# Patient Record
Sex: Female | Born: 1955 | Race: White | Hispanic: No | Marital: Single | Smoking: Never smoker
Health system: Southern US, Community
[De-identification: ages and names within clinical notes are randomized; demographics above are authoritative.]

## PROBLEM LIST (undated history)

## (undated) DIAGNOSIS — H3530 Unspecified macular degeneration: Secondary | ICD-10-CM

## (undated) DIAGNOSIS — K567 Ileus, unspecified: Secondary | ICD-10-CM

## (undated) DIAGNOSIS — F419 Anxiety disorder, unspecified: Secondary | ICD-10-CM

## (undated) DIAGNOSIS — K219 Gastro-esophageal reflux disease without esophagitis: Secondary | ICD-10-CM

## (undated) DIAGNOSIS — D649 Anemia, unspecified: Secondary | ICD-10-CM

## (undated) DIAGNOSIS — G43909 Migraine, unspecified, not intractable, without status migrainosus: Secondary | ICD-10-CM

## (undated) DIAGNOSIS — Z87442 Personal history of urinary calculi: Secondary | ICD-10-CM

## (undated) DIAGNOSIS — F32A Depression, unspecified: Secondary | ICD-10-CM

## (undated) DIAGNOSIS — A159 Respiratory tuberculosis unspecified: Secondary | ICD-10-CM

## (undated) DIAGNOSIS — Z8249 Family history of ischemic heart disease and other diseases of the circulatory system: Secondary | ICD-10-CM

## (undated) DIAGNOSIS — I10 Essential (primary) hypertension: Secondary | ICD-10-CM

## (undated) DIAGNOSIS — Z972 Presence of dental prosthetic device (complete) (partial): Secondary | ICD-10-CM

## (undated) DIAGNOSIS — F329 Major depressive disorder, single episode, unspecified: Secondary | ICD-10-CM

## (undated) DIAGNOSIS — Z973 Presence of spectacles and contact lenses: Secondary | ICD-10-CM

## (undated) HISTORY — DX: Anxiety disorder, unspecified: F41.9

## (undated) HISTORY — DX: Migraine, unspecified, not intractable, without status migrainosus: G43.909

## (undated) HISTORY — PX: COLONOSCOPY: SHX174

## (undated) HISTORY — PX: CATARACT EXTRACTION W/ INTRAOCULAR LENS  IMPLANT, BILATERAL: SHX1307

## (undated) HISTORY — DX: Depression, unspecified: F32.A

## (undated) HISTORY — DX: Major depressive disorder, single episode, unspecified: F32.9

---

## 1984-08-28 DIAGNOSIS — A159 Respiratory tuberculosis unspecified: Secondary | ICD-10-CM

## 1984-08-28 HISTORY — DX: Respiratory tuberculosis unspecified: A15.9

## 1998-05-30 ENCOUNTER — Emergency Department (HOSPITAL_COMMUNITY): Admission: EM | Admit: 1998-05-30 | Discharge: 1998-05-30 | Payer: Self-pay | Admitting: Emergency Medicine

## 1998-06-09 ENCOUNTER — Ambulatory Visit (HOSPITAL_COMMUNITY): Admission: RE | Admit: 1998-06-09 | Discharge: 1998-06-09 | Payer: Self-pay | Admitting: Gastroenterology

## 1999-03-24 ENCOUNTER — Encounter: Payer: Self-pay | Admitting: Unknown Physician Specialty

## 1999-03-24 ENCOUNTER — Ambulatory Visit (HOSPITAL_COMMUNITY): Admission: RE | Admit: 1999-03-24 | Discharge: 1999-03-24 | Payer: Self-pay | Admitting: Unknown Physician Specialty

## 2000-01-03 ENCOUNTER — Encounter: Payer: Self-pay | Admitting: Family Medicine

## 2000-01-03 ENCOUNTER — Encounter: Admission: RE | Admit: 2000-01-03 | Discharge: 2000-01-03 | Payer: Self-pay | Admitting: Family Medicine

## 2001-01-03 ENCOUNTER — Encounter: Payer: Self-pay | Admitting: Family Medicine

## 2001-01-03 ENCOUNTER — Encounter: Admission: RE | Admit: 2001-01-03 | Discharge: 2001-01-03 | Payer: Self-pay | Admitting: Family Medicine

## 2002-01-07 ENCOUNTER — Encounter: Admission: RE | Admit: 2002-01-07 | Discharge: 2002-01-07 | Payer: Self-pay | Admitting: Family Medicine

## 2002-01-07 ENCOUNTER — Encounter: Payer: Self-pay | Admitting: Family Medicine

## 2003-01-09 ENCOUNTER — Encounter: Payer: Self-pay | Admitting: Family Medicine

## 2003-01-09 ENCOUNTER — Encounter: Admission: RE | Admit: 2003-01-09 | Discharge: 2003-01-09 | Payer: Self-pay | Admitting: Family Medicine

## 2004-11-03 ENCOUNTER — Encounter: Admission: RE | Admit: 2004-11-03 | Discharge: 2004-11-03 | Payer: Self-pay | Admitting: Family Medicine

## 2005-11-30 ENCOUNTER — Ambulatory Visit (HOSPITAL_COMMUNITY): Admission: RE | Admit: 2005-11-30 | Discharge: 2005-11-30 | Payer: Self-pay | Admitting: Family Medicine

## 2005-12-14 ENCOUNTER — Other Ambulatory Visit: Admission: RE | Admit: 2005-12-14 | Discharge: 2005-12-14 | Payer: Self-pay | Admitting: Family Medicine

## 2007-12-10 ENCOUNTER — Encounter: Admission: RE | Admit: 2007-12-10 | Discharge: 2007-12-10 | Payer: Self-pay | Admitting: Family Medicine

## 2008-07-10 ENCOUNTER — Ambulatory Visit: Admission: RE | Admit: 2008-07-10 | Discharge: 2008-07-10 | Payer: Self-pay | Admitting: Family Medicine

## 2008-07-10 ENCOUNTER — Encounter (INDEPENDENT_AMBULATORY_CARE_PROVIDER_SITE_OTHER): Payer: Self-pay | Admitting: Family Medicine

## 2008-07-10 ENCOUNTER — Ambulatory Visit: Payer: Self-pay | Admitting: Vascular Surgery

## 2008-12-29 ENCOUNTER — Ambulatory Visit (HOSPITAL_COMMUNITY): Admission: RE | Admit: 2008-12-29 | Discharge: 2008-12-29 | Payer: Self-pay | Admitting: Family Medicine

## 2010-01-19 ENCOUNTER — Ambulatory Visit (HOSPITAL_COMMUNITY): Admission: RE | Admit: 2010-01-19 | Discharge: 2010-01-19 | Payer: Self-pay | Admitting: Family Medicine

## 2010-12-12 ENCOUNTER — Other Ambulatory Visit (HOSPITAL_COMMUNITY): Payer: Self-pay | Admitting: Family Medicine

## 2010-12-12 DIAGNOSIS — Z1231 Encounter for screening mammogram for malignant neoplasm of breast: Secondary | ICD-10-CM

## 2011-01-31 ENCOUNTER — Ambulatory Visit (HOSPITAL_COMMUNITY)
Admission: RE | Admit: 2011-01-31 | Discharge: 2011-01-31 | Disposition: A | Discharge status: Home / Self Care | Payer: BC Managed Care – PPO | Source: Ambulatory Visit | Attending: Family Medicine | Admitting: Family Medicine

## 2011-01-31 DIAGNOSIS — Z1231 Encounter for screening mammogram for malignant neoplasm of breast: Secondary | ICD-10-CM | POA: Insufficient documentation

## 2011-05-16 ENCOUNTER — Ambulatory Visit: Payer: BC Managed Care – PPO | Admitting: Hematology & Oncology

## 2011-06-05 ENCOUNTER — Other Ambulatory Visit: Payer: Self-pay | Admitting: Hematology & Oncology

## 2011-06-05 ENCOUNTER — Ambulatory Visit (HOSPITAL_BASED_OUTPATIENT_CLINIC_OR_DEPARTMENT_OTHER): Payer: BC Managed Care – PPO | Admitting: Hematology & Oncology

## 2011-06-05 DIAGNOSIS — R791 Abnormal coagulation profile: Secondary | ICD-10-CM

## 2011-06-05 DIAGNOSIS — R7989 Other specified abnormal findings of blood chemistry: Secondary | ICD-10-CM

## 2011-06-05 LAB — CBC WITH DIFFERENTIAL (CANCER CENTER ONLY)
Eosinophils Absolute: 0.1 10*3/uL (ref 0.0–0.5)
MONO#: 0.4 10*3/uL (ref 0.1–0.9)
NEUT#: 3.4 10*3/uL (ref 1.5–6.5)
Platelets: 304 10*3/uL (ref 145–400)
RBC: 4.61 10*6/uL (ref 3.70–5.32)
WBC: 5.4 10*3/uL (ref 3.9–10.0)

## 2011-06-05 LAB — CHCC SATELLITE - SMEAR

## 2011-06-08 LAB — LUPUS ANTICOAGULANT PANEL
DRVVT: 36.5 secs (ref 34.1–42.2)
Lupus Anticoagulant: NOT DETECTED
PTT Lupus Anticoagulant: 41.8 secs (ref 30.0–45.6)

## 2011-06-08 LAB — VON WILLEBRAND PANEL: Coagulation Factor VIII: 48 % — ABNORMAL LOW (ref 73–140)

## 2011-06-13 LAB — LACTATE DEHYDROGENASE: LDH: 171 U/L (ref 94–250)

## 2011-06-13 LAB — CARDIOLIPIN ANTIBODIES, IGG, IGM, IGA
Anticardiolipin IgA: 3 APL U/mL (ref <22)
Anticardiolipin IgM: 4 MPL U/mL (ref <11)

## 2011-12-27 ENCOUNTER — Other Ambulatory Visit (HOSPITAL_COMMUNITY): Payer: Self-pay | Admitting: Family Medicine

## 2011-12-27 DIAGNOSIS — Z1231 Encounter for screening mammogram for malignant neoplasm of breast: Secondary | ICD-10-CM

## 2012-02-23 ENCOUNTER — Ambulatory Visit (HOSPITAL_COMMUNITY)
Admission: RE | Admit: 2012-02-23 | Discharge: 2012-02-23 | Disposition: A | Discharge status: Home / Self Care | Payer: BC Managed Care – PPO | Source: Ambulatory Visit | Attending: Family Medicine | Admitting: Family Medicine

## 2012-02-23 DIAGNOSIS — Z1231 Encounter for screening mammogram for malignant neoplasm of breast: Secondary | ICD-10-CM

## 2013-02-10 ENCOUNTER — Other Ambulatory Visit: Payer: Self-pay | Admitting: Family Medicine

## 2013-02-10 DIAGNOSIS — H539 Unspecified visual disturbance: Secondary | ICD-10-CM

## 2013-02-12 ENCOUNTER — Ambulatory Visit
Admission: RE | Admit: 2013-02-12 | Discharge: 2013-02-12 | Disposition: A | Discharge status: Home / Self Care | Payer: BC Managed Care – PPO | Source: Ambulatory Visit | Attending: Family Medicine | Admitting: Family Medicine

## 2013-02-12 DIAGNOSIS — H539 Unspecified visual disturbance: Secondary | ICD-10-CM

## 2013-02-25 ENCOUNTER — Other Ambulatory Visit (HOSPITAL_COMMUNITY): Payer: Self-pay | Admitting: Family Medicine

## 2013-02-25 DIAGNOSIS — Z1231 Encounter for screening mammogram for malignant neoplasm of breast: Secondary | ICD-10-CM

## 2013-03-05 ENCOUNTER — Ambulatory Visit (HOSPITAL_COMMUNITY)
Admission: RE | Admit: 2013-03-05 | Discharge: 2013-03-05 | Disposition: A | Discharge status: Home / Self Care | Payer: BC Managed Care – PPO | Source: Ambulatory Visit | Attending: Family Medicine | Admitting: Family Medicine

## 2013-03-05 DIAGNOSIS — Z1231 Encounter for screening mammogram for malignant neoplasm of breast: Secondary | ICD-10-CM | POA: Insufficient documentation

## 2013-03-10 ENCOUNTER — Ambulatory Visit (INDEPENDENT_AMBULATORY_CARE_PROVIDER_SITE_OTHER): Payer: BC Managed Care – PPO | Admitting: Diagnostic Neuroimaging

## 2013-03-10 ENCOUNTER — Encounter: Payer: Self-pay | Admitting: Diagnostic Neuroimaging

## 2013-03-10 VITALS — BP 126/78 | HR 76 | Temp 99.1°F | Ht 62.0 in | Wt 193.0 lb

## 2013-03-10 DIAGNOSIS — G43809 Other migraine, not intractable, without status migrainosus: Secondary | ICD-10-CM

## 2013-03-10 DIAGNOSIS — G43109 Migraine with aura, not intractable, without status migrainosus: Secondary | ICD-10-CM

## 2013-03-10 NOTE — Progress Notes (Signed)
[facility]  PATIENT: Kristin Myers DOB: [birth year]  REFERRING CLINICIAN: Little HISTORY FROM: patient REASON FOR VISIT: new consult   HISTORICAL  CHIEF COMPLAINT:  Chief Complaint  Patient presents with  . Headache    Left side behind eye    HISTORY OF PRESENT ILLNESS:   57 year old right-handed female here for evaluation of headache and vision changes.  3 weeks ago patient was at work and had the sudden onset of "smudged over the left eye, which progressed to loss of vision in the left eye for 15 minutes. She then had a severe throbbing headache on left side with nausea, dizziness, photophobia and phonophobia. Patient has been evaluated by ophthalmology and PCP. MRI of the brain and carotid ultrasound up and down.  Since that time she continues to have dull aching headache on left side, with some decreased sensation left face, left arm and left leg.  Patient has remote history of migraine headaches since 1976, during her freshman year of college. No further migraines since that time.  REVIEW OF SYSTEMS: Full 14 system review of systems performed and notable only for weight gain swelling in legs ringing in ear spinning sensation blurred vision loss of vision eye pain anemia easy bruising headache numbness weakness dizziness.  ALLERGIES: Allergies  Allergen Reactions  . Bee Venom   . Sulfa Antibiotics   . Tramadol     HOME MEDICATIONS: No outpatient prescriptions prior to visit.   No facility-administered medications prior to visit.    PAST MEDICAL HISTORY: Past Medical History  Diagnosis Date  . Migraine   . Depression   . Anxiety     PAST SURGICAL HISTORY: History reviewed. No pertinent past surgical history.  FAMILY HISTORY: Family History  Problem Relation Age of Onset  . Heart attack Father   . Breast cancer Sister     SOCIAL HISTORY:  History   Social History  . Marital Status: Single    Spouse Name: N/A   Number of Children: 2  . Years of Education: MA   Occupational History  . RECEIVER [employer]   Social History Main Topics  . Smoking status: Never Smoker   . Smokeless tobacco: Never Used  . Alcohol Use: Yes     Comment: occasionally; once or twice annually  . Drug Use: No  . Sexually Active: Not on file   Other Topics Concern  . Not on file   Social History Narrative   Patient lives alone.    Caffeine Use: 1 soda daily.     PHYSICAL EXAM  Filed Vitals:   03/10/13 1102  BP: 126/78  Pulse: 76  Temp: 99.1 F (37.3 C)  TempSrc: Oral  Height: 5\' 2"  (1.575 m)  Weight: 193 lb (87.544 kg)    Not recorded    Body mass index is 35.29 kg/(m^2).  GENERAL EXAM: Patient is in no distress  CARDIOVASCULAR: Regular rate and rhythm, no murmurs, no carotid bruits  NEUROLOGIC: MENTAL STATUS: awake, alert, language fluent, comprehension intact, naming intact CRANIAL NERVE: no papilledema on fundoscopic exam, pupils equal and reactive to light, visual fields full to confrontation, extraocular muscles intact, no nystagmus, facial sensation and strength symmetric, uvula midline, shoulder shrug symmetric, tongue midline. MOTOR: normal bulk and tone, full strength in the BUE, BLE SENSORY: normal and symmetric to light touch, pinprick, temperature, vibration COORDINATION: finger-nose-finger, fine finger movements normal REFLEXES: deep tendon reflexes present and symmetric GAIT/STATION: narrow based gait; able to walk on toes, heels and tandem; romberg  is negative   DIAGNOSTIC DATA (LABS, IMAGING, TESTING) - I reviewed patient records, labs, notes, testing and imaging myself where available.  Lab Results  Component Value Date   WBC 5.4 06/05/2011   HGB 13.2 06/05/2011   HCT 38.0 06/05/2011   MCV 82 06/05/2011   PLT 304 06/05/2011   No results found for this basename: na, k, cl, co2, glucose, bun, creatinine, calcium, prot, albumin, ast, alt, alkphos, bilitot, gfrnonaa, gfraa   No  results found for this basename: CHOL, HDL, LDLCALC, LDLDIRECT, TRIG, CHOLHDL   No results found for this basename: HGBA1C   No results found for this basename: VITAMINB12   No results found for this basename: TSH    02/12/13 MRI brain - No acute infarct. Minimal nonspecific white matter type changes as noted above.  02/12/13 carotid u/s - normal    ASSESSMENT AND PLAN  57 y.o. year old female  has a past medical history of Migraine; Depression; and Anxiety. 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 TIA less likely.  PLAN: 1. Ask PCP to complete TIA workup if not already done (2d-echocardiogram, lipid panel, diabetes screening) 2. Continue aspirin 81mg  daily 3. Regarding headaches, we will observe for next 4 weeks and see if her headaches resolve. She has been headache free otherwise since 1976 and therefore we will see how she does over next few months before instituting regular antimigraine treatments.   No orders of the defined types were placed in this encounter.     Meds ordered this encounter  Medications  . HYDROcodone-acetaminophen (NORCO) 10-325 MG per tablet    Sig: Take 1 tablet by mouth as needed.  . promethazine (PHENERGAN) 25 MG tablet    Sig: Take 1 tablet by mouth as needed.     Suanne Marker, MD 03/10/2013, 11:58 AM Certified in Neurology, Neurophysiology and Neuroimaging  [facility] [address], [address] [place], [place] [address] [phone]

## 2013-03-10 NOTE — Patient Instructions (Signed)
Use ibuprofen or aleve as needed for breakthrough headache.

## 2014-02-02 ENCOUNTER — Other Ambulatory Visit (HOSPITAL_COMMUNITY): Payer: Self-pay | Admitting: Family Medicine

## 2014-02-02 DIAGNOSIS — Z1231 Encounter for screening mammogram for malignant neoplasm of breast: Secondary | ICD-10-CM

## 2014-03-09 ENCOUNTER — Ambulatory Visit (HOSPITAL_COMMUNITY)
Admission: RE | Admit: 2014-03-09 | Discharge: 2014-03-09 | Disposition: A | Discharge status: Home / Self Care | Payer: BC Managed Care – PPO | Source: Ambulatory Visit | Attending: Family Medicine | Admitting: Family Medicine

## 2014-03-09 DIAGNOSIS — Z1231 Encounter for screening mammogram for malignant neoplasm of breast: Secondary | ICD-10-CM

## 2015-02-11 ENCOUNTER — Other Ambulatory Visit (HOSPITAL_COMMUNITY): Payer: Self-pay | Admitting: Family Medicine

## 2015-02-11 DIAGNOSIS — Z1231 Encounter for screening mammogram for malignant neoplasm of breast: Secondary | ICD-10-CM

## 2015-03-26 ENCOUNTER — Ambulatory Visit (HOSPITAL_COMMUNITY)
Admission: RE | Admit: 2015-03-26 | Discharge: 2015-03-26 | Disposition: A | Discharge status: Home / Self Care | Payer: BLUE CROSS/BLUE SHIELD | Source: Ambulatory Visit | Attending: Family Medicine | Admitting: Family Medicine

## 2015-03-26 DIAGNOSIS — Z1231 Encounter for screening mammogram for malignant neoplasm of breast: Secondary | ICD-10-CM | POA: Insufficient documentation

## 2015-11-03 ENCOUNTER — Other Ambulatory Visit: Payer: Self-pay | Admitting: Gastroenterology

## 2015-11-03 DIAGNOSIS — R4702 Dysphasia: Secondary | ICD-10-CM

## 2015-11-09 ENCOUNTER — Ambulatory Visit
Admission: RE | Admit: 2015-11-09 | Discharge: 2015-11-09 | Disposition: A | Discharge status: Home / Self Care | Payer: BLUE CROSS/BLUE SHIELD | Source: Ambulatory Visit | Attending: Gastroenterology | Admitting: Gastroenterology

## 2015-11-09 ENCOUNTER — Other Ambulatory Visit: Payer: Self-pay | Admitting: Gastroenterology

## 2015-11-09 DIAGNOSIS — R1310 Dysphagia, unspecified: Secondary | ICD-10-CM

## 2015-11-09 DIAGNOSIS — R4702 Dysphasia: Secondary | ICD-10-CM

## 2015-11-23 ENCOUNTER — Other Ambulatory Visit: Payer: Self-pay | Admitting: Gastroenterology

## 2015-11-24 ENCOUNTER — Encounter (HOSPITAL_COMMUNITY): Payer: Self-pay | Admitting: *Deleted

## 2015-11-24 NOTE — Progress Notes (Signed)
Pt denies SOB, chest pain, and being under the care of a cardiologist. Pt denies having a stress test, echo and cardiac cath. Pt denies having a chest x ray and EKG within the last year. Pt made aware to stop taking Aspirin, vitamins, fish oil and herbal medications. Do not take any NSAIDs ie: Ibuprofen, Advil, Naproxen, BC and Goody Powder or any medication containing Aspirin. Pt verbalized understanding of all pre-op instructions. 

## 2015-11-25 ENCOUNTER — Ambulatory Visit (HOSPITAL_COMMUNITY)
Admission: RE | Admit: 2015-11-25 | Discharge: 2015-11-25 | Disposition: A | Discharge status: Home / Self Care | Payer: BLUE CROSS/BLUE SHIELD | Source: Ambulatory Visit | Attending: Gastroenterology | Admitting: Gastroenterology

## 2015-11-25 ENCOUNTER — Ambulatory Visit (HOSPITAL_COMMUNITY): Payer: BLUE CROSS/BLUE SHIELD

## 2015-11-25 ENCOUNTER — Ambulatory Visit (HOSPITAL_COMMUNITY): Payer: BLUE CROSS/BLUE SHIELD | Admitting: Anesthesiology

## 2015-11-25 ENCOUNTER — Encounter (HOSPITAL_COMMUNITY)
Admission: RE | Disposition: A | Discharge status: Home / Self Care | Payer: Self-pay | Source: Ambulatory Visit | Attending: Gastroenterology

## 2015-11-25 ENCOUNTER — Encounter (HOSPITAL_COMMUNITY): Payer: Self-pay | Admitting: *Deleted

## 2015-11-25 DIAGNOSIS — R1310 Dysphagia, unspecified: Secondary | ICD-10-CM | POA: Insufficient documentation

## 2015-11-25 DIAGNOSIS — K210 Gastro-esophageal reflux disease with esophagitis: Secondary | ICD-10-CM | POA: Diagnosis not present

## 2015-11-25 DIAGNOSIS — K222 Esophageal obstruction: Secondary | ICD-10-CM | POA: Diagnosis not present

## 2015-11-25 DIAGNOSIS — Z8719 Personal history of other diseases of the digestive system: Secondary | ICD-10-CM

## 2015-11-25 HISTORY — DX: Respiratory tuberculosis unspecified: A15.9

## 2015-11-25 HISTORY — DX: Personal history of other diseases of the digestive system: Z87.19

## 2015-11-25 HISTORY — PX: SAVORY DILATION: SHX5439

## 2015-11-25 HISTORY — DX: Gastro-esophageal reflux disease without esophagitis: K21.9

## 2015-11-25 HISTORY — PX: ESOPHAGOGASTRODUODENOSCOPY (EGD) WITH PROPOFOL: SHX5813

## 2015-11-25 SURGERY — ESOPHAGOGASTRODUODENOSCOPY (EGD) WITH PROPOFOL
Anesthesia: Monitor Anesthesia Care

## 2015-11-25 MED ORDER — BUTAMBEN-TETRACAINE-BENZOCAINE 2-2-14 % EX AERO
INHALATION_SPRAY | CUTANEOUS | Status: DC | PRN
  Administered 2015-11-25: 2 via TOPICAL

## 2015-11-25 MED ORDER — ONDANSETRON HCL 4 MG/2ML IJ SOLN
INTRAMUSCULAR | Status: DC | PRN
  Administered 2015-11-25: 4 mg via INTRAVENOUS

## 2015-11-25 MED ORDER — PROPOFOL 500 MG/50ML IV EMUL
INTRAVENOUS | Status: DC | PRN
  Administered 2015-11-25: 50 ug/kg/min via INTRAVENOUS

## 2015-11-25 MED ORDER — MIDAZOLAM HCL 5 MG/5ML IJ SOLN
INTRAMUSCULAR | Status: DC | PRN
  Administered 2015-11-25: 2 mg via INTRAVENOUS

## 2015-11-25 MED ORDER — FENTANYL CITRATE (PF) 100 MCG/2ML IJ SOLN
INTRAMUSCULAR | Status: DC | PRN
  Administered 2015-11-25 (×4): 50 ug via INTRAVENOUS

## 2015-11-25 MED ORDER — SODIUM CHLORIDE 0.9 % IV SOLN: INTRAVENOUS | Status: DC

## 2015-11-25 MED ORDER — LACTATED RINGERS IV SOLN
INTRAVENOUS | Status: DC
  Administered 2015-11-25: 1000 mL via INTRAVENOUS

## 2015-11-25 NOTE — H&P (Signed)
  Subjective:   Patient is a 60 y.o. female presents with History of reflux. Had a barium swallow showing esophageal stricture. He's been on Nexium twice a day still having the very small bites. The barium pill did not past.. Procedure including risks and benefits discussed in office.  Patient Active Problem List   Diagnosis Date Noted  . 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 Migraine   . Depression   . Anxiety   . Tuberculosis     Do not take PPD anymore;tested positive and was treated  . GERD (gastroesophageal reflux disease)     Past Surgical History  Procedure Laterality Date  . Cataract extraction w/ intraocular lens  implant, bilateral    . Colonoscopy      x 2    Prescriptions prior to admission  Medication Sig Dispense Refill Last Dose  . Melatonin 5 MG CAPS Take 5 mg by mouth daily as needed.   11/24/2015 at 2100  . polyethylene glycol powder (GLYCOLAX/MIRALAX) powder Take 1 Container by mouth daily as needed (one capful as needed).   Past Week at Unknown time  . HYDROcodone-acetaminophen (NORCO) 10-325 MG per tablet Take 1 tablet by mouth as needed.   More than a month at Unknown time  . promethazine (PHENERGAN) 25 MG tablet Take 1 tablet by mouth as needed.   More than a month at Unknown time   Allergies  Allergen Reactions  . Latex Rash    Rash in entire mouth  . Bee Venom   . Sulfa Antibiotics   . Tramadol     Social History  Substance Use Topics  . Smoking status: Never Smoker   . Smokeless tobacco: Never Used  . Alcohol Use: Yes     Comment: rare; once or twice annually    Family History  Problem Relation Age of Onset  . Heart attack Father   . Breast cancer Sister      Objective:   Patient Vitals for the past 8 hrs:  BP Temp Temp src Pulse Resp SpO2 Height Weight  11/25/15 1302 (!) 150/71 mmHg 98.2 F (36.8 C) Oral 75 13 97 % 5\' 2"  (1.575 m) 83.915 kg (185 lb)         See MD Preop  evaluation      Assessment:   1. Peptic stricture  Plan:   Will proceed with EGD and severally dilatation using fluoroscopic guidance. Have discussed with the patient in the office including risk of bleeding and perforation and discussed with her again.

## 2015-11-25 NOTE — Op Note (Signed)
[facility] Patient Name: Kristin Myers Procedure Date : 11/25/2015 MRN: [id number] Attending MD: Nancy Fetter , MD Date of Birth: 12/30/1955 CSN: [id number] Age: 60 Admit Type: Outpatient Procedure:                Upper GI endoscopy with Azzie Almas of Stricture Indications:              Indigestion, Dysphagia Providers:                Jeneen Rinks L. Oletta Lamas, MD, Malka So, RN, Alfonso Patten, Technician, Merrilyn Puma, CRNA, Rebekah Chesterfield, CRNA Referring MD:              Medicines:                Fentanyl 200 micrograms IV, Midazolam 2 mg IV,                            Propofol bolus dose 50 mg IV Complications:            No immediate complications. Estimated Blood Loss:     Estimated blood loss was minimal. Procedure:                Pre-Anesthesia Assessment:                           - Prior to the procedure, a History and Physical                            was performed, and patient medications and                            allergies were reviewed. The patient's tolerance of                            previous anesthesia was also reviewed. The risks                            and benefits of the procedure and the sedation                            options and risks were discussed with the patient.                            All questions were answered, and informed consent                            was obtained. Prior Anticoagulants: The patient has                            taken no previous anticoagulant or antiplatelet  agents. ASA Grade Assessment: II - A patient with                            mild systemic disease. After reviewing the risks                            and benefits, the patient was deemed in                            satisfactory condition to undergo the procedure.                           After obtaining informed consent, the endoscope was              passed under direct vision. Throughout the                            procedure, the patient's blood pressure, pulse, and                            oxygen saturations were monitored continuously. The                            [id number]) scope was introduced through the                            mouth, and advanced to the second part of duodenum.                            The upper GI endoscopy was accomplished without                            difficulty. The patient tolerated the procedure                            well. Scope In: Scope Out: Findings:      Mildly severe esophagitis with no bleeding was found in the lower third       of the esophagus.      One severe benign-appearing, intrinsic stenosis was found at the       gastroesophageal junction. And was traversed. A guidewire was placed       under fluoroscopic guidance and the scope was withdrawn. Dilation was       performed with a Savary dilator with no resistance at 12.8 mm and 14 mm.       Heme on both dilators.      The stomach was normal.      The examined duodenum was normal. Impression:               - Mildly severe reflux esophagitis.                           - Benign-appearing esophageal stenosis. Dilated.                           - Normal stomach.                           -  Normal examined duodenum.                           - No specimens collected. Moderate Sedation:      MAC by anesthesia Recommendation:           - Patient has a contact number available for                            emergencies. The signs and symptoms of potential                            delayed complications were discussed with the                            patient. Return to normal activities tomorrow.                            Written discharge instructions were provided to the                            patient.                           - Clear liquid diet today.                           - Continue  present medications.                           - Return to endoscopist in 6 weeks. Procedure Code(s):        --- Professional ---                           [phone], Esophagogastroduodenoscopy, flexible,                            transoral; with insertion of guide wire followed by                            passage of dilator(s) through esophagus over guide                            wire Diagnosis Code(s):        --- Professional ---                           K22.2, Esophageal obstruction                           K21.0, Gastro-esophageal reflux disease with                            esophagitis                           K30, Functional dyspepsia  R13.10, Dysphagia, unspecified CPT copyright 2016 American Medical Association. All rights reserved. The codes documented in this report are preliminary and upon coder review may  be revised to meet current compliance requirements. Laurence Spates, MD Nancy Fetter, MD 11/25/2015 2:46:52 PM This report has been signed electronically. Number of Addenda: 0

## 2015-11-25 NOTE — Anesthesia Postprocedure Evaluation (Signed)
Anesthesia Post Note  Patient: Kristin Myers  Procedure(s) Performed: Procedure(s) (LRB): ESOPHAGOGASTRODUODENOSCOPY (EGD) WITH PROPOFOL (N/A) SAVORY DILATION (N/A)  Patient location during evaluation: Endoscopy Anesthesia Type: MAC Level of consciousness: awake and alert Pain management: pain level controlled Vital Signs Assessment: post-procedure vital signs reviewed and stable Respiratory status: spontaneous breathing, nonlabored ventilation, respiratory function stable and patient connected to nasal cannula oxygen Cardiovascular status: stable and blood pressure returned to baseline Anesthetic complications: no    Last Vitals:  Filed Vitals:   11/25/15 1450 11/25/15 1500  BP: 146/69 143/65  Pulse: 73 71  Temp:    Resp: 22 15    Last Pain: There were no vitals filed for this visit.               Montez Hageman

## 2015-11-25 NOTE — Transfer of Care (Signed)
Immediate Anesthesia Transfer of Care Note  Patient: Kristin Myers  Procedure(s) Performed: Procedure(s): ESOPHAGOGASTRODUODENOSCOPY (EGD) WITH PROPOFOL (N/A) SAVORY DILATION (N/A)  Patient Location: PACU and Endoscopy Unit  Anesthesia Type:MAC  Level of Consciousness: awake, alert , oriented and patient cooperative  Airway & Oxygen Therapy: Patient Spontanous Breathing and Patient connected to nasal cannula oxygen  Post-op Assessment: Report given to RN, Post -op Vital signs reviewed and stable and Patient moving all extremities  Post vital signs: Reviewed and stable  Last Vitals:  Filed Vitals:   11/25/15 1302  BP: 150/71  Pulse: 75  Temp: 36.8 C  Resp: 13    Complications: No apparent anesthesia complications

## 2015-11-25 NOTE — Anesthesia Preprocedure Evaluation (Addendum)
Anesthesia Evaluation  Patient identified by MRN, date of birth, ID band Patient awake    Reviewed: Allergy & Precautions, NPO status , Patient's Chart, lab work & pertinent test results  Airway Mallampati: II  TM Distance: >3 FB Neck ROM: Full    Dental no notable dental hx. (+) Dental Advisory Given   Pulmonary neg pulmonary ROS,    Pulmonary exam normal breath sounds clear to auscultation       Cardiovascular negative cardio ROS Normal cardiovascular exam Rhythm:Regular Rate:Normal     Neuro/Psych  Headaches, Anxiety Depression negative neurological ROS  negative psych ROS   GI/Hepatic negative GI ROS, Neg liver ROS, GERD  ,  Endo/Other  negative endocrine ROS  Renal/GU negative Renal ROS  negative genitourinary   Musculoskeletal negative musculoskeletal ROS (+)   Abdominal   Peds negative pediatric ROS (+)  Hematology negative hematology ROS (+)   Anesthesia Other Findings   Reproductive/Obstetrics negative OB ROS                            Anesthesia Physical Anesthesia Plan  ASA: II  Anesthesia Plan: MAC   Post-op Pain Management:    Induction: Intravenous  Airway Management Planned: Nasal Cannula  Additional Equipment:   Intra-op Plan:   Post-operative Plan:   Informed Consent: I have reviewed the patients History and Physical, chart, labs and discussed the procedure including the risks, benefits and alternatives for the proposed anesthesia with the patient or authorized representative who has indicated his/her understanding and acceptance.   Dental advisory given  Plan Discussed with: Anesthesiologist and Surgeon  Anesthesia Plan Comments:         Anesthesia Quick Evaluation

## 2015-11-25 NOTE — Discharge Instructions (Signed)
Esophagogastroduodenoscopy, Care After °Refer to this sheet in the next few weeks. These instructions provide you with information about caring for yourself after your procedure. Your health care provider may also give you more specific instructions. Your treatment has been planned according to current medical practices, but problems sometimes occur. Call your health care provider if you have any problems or questions after your procedure. °WHAT TO EXPECT AFTER THE PROCEDURE °After your procedure, it is typical to feel: °· Soreness in your throat. °· Pain with swallowing. °· Sick to your stomach (nauseous). °· Bloated. °· Dizzy. °· Fatigued. °HOME CARE INSTRUCTIONS °· Do not eat or drink anything until the numbing medicine (local anesthetic) has worn off and your gag reflex has returned. You will know that the local anesthetic has worn off when you can swallow comfortably. °· Do not drive or operate machinery until directed by your health care provider. °· Take medicines only as directed by your health care provider. °SEEK MEDICAL CARE IF:  °· You cannot stop coughing. °· You are not urinating at all or less than usual. °SEEK IMMEDIATE MEDICAL CARE IF: °· You have difficulty swallowing. °· You cannot eat or drink. °· You have worsening throat or chest pain. °· You have dizziness or lightheadedness or you faint. °· You have nausea or vomiting. °· You have chills. °· You have a fever. °· You have severe abdominal pain. °· You have black, tarry, or bloody stools. °  °This information is not intended to replace advice given to you by your health care provider. Make sure you discuss any questions you have with your health care provider. °  °Document Released: 07/31/2012 Document Revised: 09/04/2014 Document Reviewed: 07/31/2012 °Elsevier Interactive Patient Education ©2016 Elsevier Inc. °Continue current meds. Clear liquids for 4-6 hours, if no chest pain or trouble breathing, soft foods tonight.  Regular diet tomorrow.  Call for problems. °

## 2015-11-26 ENCOUNTER — Encounter (HOSPITAL_COMMUNITY): Payer: Self-pay | Admitting: Gastroenterology

## 2016-03-01 ENCOUNTER — Other Ambulatory Visit: Payer: Self-pay | Admitting: Family Medicine

## 2016-03-01 DIAGNOSIS — Z1231 Encounter for screening mammogram for malignant neoplasm of breast: Secondary | ICD-10-CM

## 2016-03-01 DIAGNOSIS — Z803 Family history of malignant neoplasm of breast: Secondary | ICD-10-CM

## 2016-03-16 ENCOUNTER — Other Ambulatory Visit (HOSPITAL_COMMUNITY)
Admission: RE | Admit: 2016-03-16 | Discharge: 2016-03-16 | Disposition: A | Discharge status: Home / Self Care | Payer: BLUE CROSS/BLUE SHIELD | Source: Ambulatory Visit | Attending: Obstetrics and Gynecology | Admitting: Obstetrics and Gynecology

## 2016-03-16 DIAGNOSIS — Z124 Encounter for screening for malignant neoplasm of cervix: Secondary | ICD-10-CM | POA: Diagnosis present

## 2016-03-16 DIAGNOSIS — Z1151 Encounter for screening for human papillomavirus (HPV): Secondary | ICD-10-CM | POA: Diagnosis not present

## 2016-03-17 ENCOUNTER — Other Ambulatory Visit: Payer: Self-pay | Admitting: Family Medicine

## 2016-03-21 LAB — CYTOLOGY - PAP

## 2016-03-27 ENCOUNTER — Ambulatory Visit: Payer: BLUE CROSS/BLUE SHIELD

## 2016-05-30 ENCOUNTER — Ambulatory Visit: Payer: BLUE CROSS/BLUE SHIELD

## 2016-05-30 ENCOUNTER — Ambulatory Visit
Admission: RE | Admit: 2016-05-30 | Discharge: 2016-05-30 | Disposition: A | Discharge status: Home / Self Care | Payer: BLUE CROSS/BLUE SHIELD | Source: Ambulatory Visit | Attending: Family Medicine | Admitting: Family Medicine

## 2016-05-30 DIAGNOSIS — Z1231 Encounter for screening mammogram for malignant neoplasm of breast: Secondary | ICD-10-CM

## 2016-05-30 DIAGNOSIS — Z803 Family history of malignant neoplasm of breast: Secondary | ICD-10-CM

## 2017-07-04 ENCOUNTER — Other Ambulatory Visit: Payer: Self-pay | Admitting: Family Medicine

## 2017-07-04 DIAGNOSIS — Z139 Encounter for screening, unspecified: Secondary | ICD-10-CM

## 2017-08-06 ENCOUNTER — Ambulatory Visit: Payer: BLUE CROSS/BLUE SHIELD

## 2017-09-03 ENCOUNTER — Ambulatory Visit: Payer: BLUE CROSS/BLUE SHIELD

## 2018-05-14 ENCOUNTER — Ambulatory Visit: Payer: BLUE CROSS/BLUE SHIELD

## 2018-05-14 ENCOUNTER — Ambulatory Visit
Admission: RE | Admit: 2018-05-14 | Discharge: 2018-05-14 | Disposition: A | Discharge status: Home / Self Care | Payer: BLUE CROSS/BLUE SHIELD | Source: Ambulatory Visit | Attending: Family Medicine | Admitting: Family Medicine

## 2018-05-14 DIAGNOSIS — Z139 Encounter for screening, unspecified: Secondary | ICD-10-CM

## 2019-06-24 ENCOUNTER — Other Ambulatory Visit: Payer: Self-pay | Admitting: Family Medicine

## 2019-06-24 DIAGNOSIS — Z1231 Encounter for screening mammogram for malignant neoplasm of breast: Secondary | ICD-10-CM

## 2019-08-12 ENCOUNTER — Ambulatory Visit: Payer: BLUE CROSS/BLUE SHIELD

## 2019-08-19 ENCOUNTER — Ambulatory Visit
Admission: RE | Admit: 2019-08-19 | Discharge: 2019-08-19 | Disposition: A | Discharge status: Home / Self Care | Payer: BC Managed Care – PPO | Source: Ambulatory Visit | Attending: Family Medicine | Admitting: Family Medicine

## 2019-08-19 ENCOUNTER — Other Ambulatory Visit: Payer: Self-pay

## 2019-08-19 DIAGNOSIS — Z1231 Encounter for screening mammogram for malignant neoplasm of breast: Secondary | ICD-10-CM

## 2019-11-01 ENCOUNTER — Ambulatory Visit: Payer: BC Managed Care – PPO | Attending: Internal Medicine

## 2019-11-01 DIAGNOSIS — Z23 Encounter for immunization: Secondary | ICD-10-CM

## 2019-11-01 NOTE — Progress Notes (Signed)
   Covid-19 Vaccination Clinic  Name:  BIRIDIANA HOUSEY    MRN: [id number] DOB: Jan 02, 1956  11/01/2019  Ms. Gin was observed post Covid-19 immunization for 15 minutes without incident. She was provided with Vaccine Information Sheet and instruction to access the V-Safe system.   Ms. Bigness was instructed to call 911 with any severe reactions post vaccine: Marland Kitchen Difficulty breathing  . Swelling of face and throat  . A fast heartbeat  . A bad rash all over body  . Dizziness and weakness   Immunizations Administered    Name Date Dose VIS Date Route   Pfizer COVID-19 Vaccine 11/01/2019  3:45 PM 0.3 mL 08/08/2019 Intramuscular   Manufacturer: [place]   Lot: [id number]   [place]: [id number]

## 2019-12-02 ENCOUNTER — Ambulatory Visit: Payer: BC Managed Care – PPO

## 2019-12-03 ENCOUNTER — Ambulatory Visit: Payer: BC Managed Care – PPO | Attending: Internal Medicine

## 2019-12-03 DIAGNOSIS — Z23 Encounter for immunization: Secondary | ICD-10-CM

## 2019-12-03 NOTE — Progress Notes (Signed)
   Covid-19 Vaccination Clinic  Name:  Kristin Myers    MRN: [id number] DOB: 08/07/1956  12/03/2019  Kristin Myers was observed post Covid-19 immunization for 15 minutes without incident. She was provided with Vaccine Information Sheet and instruction to access the V-Safe system.   Kristin Myers was instructed to call 911 with any severe reactions post vaccine: Marland Kitchen Difficulty breathing  . Swelling of face and throat  . A fast heartbeat  . A bad rash all over body  . Dizziness and weakness   Immunizations Administered    Name Date Dose VIS Date Route   Pfizer COVID-19 Vaccine 12/03/2019  4:04 PM 0.3 mL 08/08/2019 Intramuscular   Manufacturer: [place]   Lot: [id number]   [place]: [id number]

## 2020-07-14 ENCOUNTER — Other Ambulatory Visit: Payer: Self-pay | Admitting: Family Medicine

## 2020-07-14 DIAGNOSIS — Z1231 Encounter for screening mammogram for malignant neoplasm of breast: Secondary | ICD-10-CM

## 2020-08-26 ENCOUNTER — Ambulatory Visit
Admission: RE | Admit: 2020-08-26 | Discharge: 2020-08-26 | Disposition: A | Discharge status: Home / Self Care | Payer: BC Managed Care – PPO | Source: Ambulatory Visit | Attending: Family Medicine | Admitting: Family Medicine

## 2020-08-26 ENCOUNTER — Other Ambulatory Visit: Payer: Self-pay

## 2020-08-26 DIAGNOSIS — Z1231 Encounter for screening mammogram for malignant neoplasm of breast: Secondary | ICD-10-CM

## 2020-08-28 DIAGNOSIS — U071 COVID-19: Secondary | ICD-10-CM

## 2020-08-28 HISTORY — DX: COVID-19: U07.1

## 2020-12-24 ENCOUNTER — Other Ambulatory Visit: Payer: Self-pay | Admitting: Family Medicine

## 2020-12-24 DIAGNOSIS — R9389 Abnormal findings on diagnostic imaging of other specified body structures: Secondary | ICD-10-CM

## 2021-01-05 ENCOUNTER — Ambulatory Visit
Admission: RE | Admit: 2021-01-05 | Discharge: 2021-01-05 | Disposition: A | Discharge status: Home / Self Care | Payer: BC Managed Care – PPO | Source: Ambulatory Visit | Attending: Family Medicine | Admitting: Family Medicine

## 2021-01-05 DIAGNOSIS — R9389 Abnormal findings on diagnostic imaging of other specified body structures: Secondary | ICD-10-CM

## 2021-07-28 ENCOUNTER — Other Ambulatory Visit: Payer: Self-pay | Admitting: Family Medicine

## 2021-07-28 DIAGNOSIS — Z1231 Encounter for screening mammogram for malignant neoplasm of breast: Secondary | ICD-10-CM

## 2021-08-16 DIAGNOSIS — H353231 Exudative age-related macular degeneration, bilateral, with active choroidal neovascularization: Secondary | ICD-10-CM | POA: Diagnosis not present

## 2021-08-18 DIAGNOSIS — Z7722 Contact with and (suspected) exposure to environmental tobacco smoke (acute) (chronic): Secondary | ICD-10-CM | POA: Diagnosis not present

## 2021-08-18 DIAGNOSIS — M1990 Unspecified osteoarthritis, unspecified site: Secondary | ICD-10-CM | POA: Diagnosis not present

## 2021-08-18 DIAGNOSIS — Z791 Long term (current) use of non-steroidal anti-inflammatories (NSAID): Secondary | ICD-10-CM | POA: Diagnosis not present

## 2021-08-18 DIAGNOSIS — G4700 Insomnia, unspecified: Secondary | ICD-10-CM | POA: Diagnosis not present

## 2021-08-18 DIAGNOSIS — K5900 Constipation, unspecified: Secondary | ICD-10-CM | POA: Diagnosis not present

## 2021-08-18 DIAGNOSIS — R030 Elevated blood-pressure reading, without diagnosis of hypertension: Secondary | ICD-10-CM | POA: Diagnosis not present

## 2021-08-18 DIAGNOSIS — Z8249 Family history of ischemic heart disease and other diseases of the circulatory system: Secondary | ICD-10-CM | POA: Diagnosis not present

## 2021-08-18 DIAGNOSIS — Z803 Family history of malignant neoplasm of breast: Secondary | ICD-10-CM | POA: Diagnosis not present

## 2021-08-18 DIAGNOSIS — F419 Anxiety disorder, unspecified: Secondary | ICD-10-CM | POA: Diagnosis not present

## 2021-08-18 DIAGNOSIS — Z6835 Body mass index (BMI) 35.0-35.9, adult: Secondary | ICD-10-CM | POA: Diagnosis not present

## 2021-08-18 DIAGNOSIS — K219 Gastro-esophageal reflux disease without esophagitis: Secondary | ICD-10-CM | POA: Diagnosis not present

## 2021-08-18 DIAGNOSIS — R69 Illness, unspecified: Secondary | ICD-10-CM | POA: Diagnosis not present

## 2021-08-18 DIAGNOSIS — G8929 Other chronic pain: Secondary | ICD-10-CM | POA: Diagnosis not present

## 2021-08-28 DIAGNOSIS — N83209 Unspecified ovarian cyst, unspecified side: Secondary | ICD-10-CM

## 2021-08-28 HISTORY — DX: Unspecified ovarian cyst, unspecified side: N83.209

## 2021-09-06 ENCOUNTER — Ambulatory Visit: Payer: BC Managed Care – PPO

## 2021-09-21 ENCOUNTER — Ambulatory Visit
Admission: RE | Admit: 2021-09-21 | Discharge: 2021-09-21 | Disposition: A | Discharge status: Home / Self Care | Payer: Medicare HMO | Source: Ambulatory Visit | Attending: Family Medicine | Admitting: Family Medicine

## 2021-09-21 DIAGNOSIS — Z1231 Encounter for screening mammogram for malignant neoplasm of breast: Secondary | ICD-10-CM | POA: Diagnosis not present

## 2021-10-04 DIAGNOSIS — H353231 Exudative age-related macular degeneration, bilateral, with active choroidal neovascularization: Secondary | ICD-10-CM | POA: Diagnosis not present

## 2021-10-04 DIAGNOSIS — H353211 Exudative age-related macular degeneration, right eye, with active choroidal neovascularization: Secondary | ICD-10-CM | POA: Diagnosis not present

## 2021-10-04 DIAGNOSIS — H33193 Other retinoschisis and retinal cysts, bilateral: Secondary | ICD-10-CM | POA: Diagnosis not present

## 2021-10-04 DIAGNOSIS — H353221 Exudative age-related macular degeneration, left eye, with active choroidal neovascularization: Secondary | ICD-10-CM | POA: Diagnosis not present

## 2021-10-04 DIAGNOSIS — H43813 Vitreous degeneration, bilateral: Secondary | ICD-10-CM | POA: Diagnosis not present

## 2021-10-04 DIAGNOSIS — H43393 Other vitreous opacities, bilateral: Secondary | ICD-10-CM | POA: Diagnosis not present

## 2021-10-12 DIAGNOSIS — R944 Abnormal results of kidney function studies: Secondary | ICD-10-CM | POA: Diagnosis not present

## 2021-10-12 DIAGNOSIS — E559 Vitamin D deficiency, unspecified: Secondary | ICD-10-CM | POA: Diagnosis not present

## 2021-10-12 DIAGNOSIS — E7800 Pure hypercholesterolemia, unspecified: Secondary | ICD-10-CM | POA: Diagnosis not present

## 2021-10-12 DIAGNOSIS — R899 Unspecified abnormal finding in specimens from other organs, systems and tissues: Secondary | ICD-10-CM | POA: Diagnosis not present

## 2021-10-24 DIAGNOSIS — E559 Vitamin D deficiency, unspecified: Secondary | ICD-10-CM | POA: Diagnosis not present

## 2021-10-24 DIAGNOSIS — R233 Spontaneous ecchymoses: Secondary | ICD-10-CM | POA: Diagnosis not present

## 2021-10-24 DIAGNOSIS — M25519 Pain in unspecified shoulder: Secondary | ICD-10-CM | POA: Diagnosis not present

## 2021-10-24 DIAGNOSIS — K219 Gastro-esophageal reflux disease without esophagitis: Secondary | ICD-10-CM | POA: Diagnosis not present

## 2021-10-24 DIAGNOSIS — Z91038 Other insect allergy status: Secondary | ICD-10-CM | POA: Diagnosis not present

## 2021-10-24 DIAGNOSIS — N83209 Unspecified ovarian cyst, unspecified side: Secondary | ICD-10-CM | POA: Diagnosis not present

## 2021-10-24 DIAGNOSIS — Z0000 Encounter for general adult medical examination without abnormal findings: Secondary | ICD-10-CM | POA: Diagnosis not present

## 2021-10-24 DIAGNOSIS — Z8262 Family history of osteoporosis: Secondary | ICD-10-CM | POA: Diagnosis not present

## 2021-10-24 DIAGNOSIS — R69 Illness, unspecified: Secondary | ICD-10-CM | POA: Diagnosis not present

## 2021-10-26 ENCOUNTER — Other Ambulatory Visit: Payer: Self-pay | Admitting: Family Medicine

## 2021-10-26 DIAGNOSIS — E2839 Other primary ovarian failure: Secondary | ICD-10-CM

## 2021-11-01 ENCOUNTER — Ambulatory Visit
Admission: RE | Admit: 2021-11-01 | Discharge: 2021-11-01 | Disposition: A | Discharge status: Home / Self Care | Payer: Medicare HMO | Source: Ambulatory Visit | Attending: Family Medicine | Admitting: Family Medicine

## 2021-11-01 ENCOUNTER — Other Ambulatory Visit: Payer: Self-pay

## 2021-11-01 DIAGNOSIS — E2839 Other primary ovarian failure: Secondary | ICD-10-CM

## 2021-11-01 DIAGNOSIS — M85852 Other specified disorders of bone density and structure, left thigh: Secondary | ICD-10-CM | POA: Diagnosis not present

## 2021-11-01 DIAGNOSIS — Z780 Asymptomatic menopausal state: Secondary | ICD-10-CM | POA: Diagnosis not present

## 2021-11-01 DIAGNOSIS — M810 Age-related osteoporosis without current pathological fracture: Secondary | ICD-10-CM | POA: Diagnosis not present

## 2021-11-15 DIAGNOSIS — H353231 Exudative age-related macular degeneration, bilateral, with active choroidal neovascularization: Secondary | ICD-10-CM | POA: Diagnosis not present

## 2021-12-14 ENCOUNTER — Other Ambulatory Visit (HOSPITAL_COMMUNITY)
Admission: RE | Admit: 2021-12-14 | Discharge: 2021-12-14 | Disposition: A | Discharge status: Home / Self Care | Payer: Medicare HMO | Source: Ambulatory Visit | Attending: Nurse Practitioner | Admitting: Nurse Practitioner

## 2021-12-14 ENCOUNTER — Other Ambulatory Visit: Payer: Self-pay | Admitting: Nurse Practitioner

## 2021-12-14 DIAGNOSIS — Z01419 Encounter for gynecological examination (general) (routine) without abnormal findings: Secondary | ICD-10-CM | POA: Diagnosis present

## 2021-12-14 DIAGNOSIS — Z803 Family history of malignant neoplasm of breast: Secondary | ICD-10-CM | POA: Diagnosis not present

## 2021-12-14 DIAGNOSIS — Z124 Encounter for screening for malignant neoplasm of cervix: Secondary | ICD-10-CM | POA: Diagnosis not present

## 2021-12-14 DIAGNOSIS — N83201 Unspecified ovarian cyst, right side: Secondary | ICD-10-CM | POA: Diagnosis not present

## 2021-12-14 DIAGNOSIS — Z1151 Encounter for screening for human papillomavirus (HPV): Secondary | ICD-10-CM | POA: Insufficient documentation

## 2021-12-14 DIAGNOSIS — Z9189 Other specified personal risk factors, not elsewhere classified: Secondary | ICD-10-CM | POA: Diagnosis not present

## 2021-12-14 DIAGNOSIS — R69 Illness, unspecified: Secondary | ICD-10-CM | POA: Diagnosis not present

## 2021-12-14 DIAGNOSIS — Z8049 Family history of malignant neoplasm of other genital organs: Secondary | ICD-10-CM | POA: Diagnosis not present

## 2021-12-14 DIAGNOSIS — R300 Dysuria: Secondary | ICD-10-CM | POA: Diagnosis not present

## 2021-12-14 DIAGNOSIS — N898 Other specified noninflammatory disorders of vagina: Secondary | ICD-10-CM | POA: Diagnosis not present

## 2021-12-16 LAB — CYTOLOGY - PAP
Comment: NEGATIVE
Diagnosis: NEGATIVE
High risk HPV: NEGATIVE

## 2021-12-20 DIAGNOSIS — H353231 Exudative age-related macular degeneration, bilateral, with active choroidal neovascularization: Secondary | ICD-10-CM | POA: Diagnosis not present

## 2022-01-04 DIAGNOSIS — H5213 Myopia, bilateral: Secondary | ICD-10-CM | POA: Diagnosis not present

## 2022-01-04 DIAGNOSIS — N83201 Unspecified ovarian cyst, right side: Secondary | ICD-10-CM | POA: Diagnosis not present

## 2022-01-30 DIAGNOSIS — H9202 Otalgia, left ear: Secondary | ICD-10-CM | POA: Diagnosis not present

## 2022-01-30 DIAGNOSIS — R030 Elevated blood-pressure reading, without diagnosis of hypertension: Secondary | ICD-10-CM | POA: Diagnosis not present

## 2022-01-30 DIAGNOSIS — H66012 Acute suppurative otitis media with spontaneous rupture of ear drum, left ear: Secondary | ICD-10-CM | POA: Diagnosis not present

## 2022-02-01 DIAGNOSIS — H6692 Otitis media, unspecified, left ear: Secondary | ICD-10-CM | POA: Diagnosis not present

## 2022-02-01 DIAGNOSIS — H7292 Unspecified perforation of tympanic membrane, left ear: Secondary | ICD-10-CM | POA: Diagnosis not present

## 2022-02-01 DIAGNOSIS — R112 Nausea with vomiting, unspecified: Secondary | ICD-10-CM | POA: Diagnosis not present

## 2022-02-01 DIAGNOSIS — Z6835 Body mass index (BMI) 35.0-35.9, adult: Secondary | ICD-10-CM | POA: Diagnosis not present

## 2022-02-14 DIAGNOSIS — H353231 Exudative age-related macular degeneration, bilateral, with active choroidal neovascularization: Secondary | ICD-10-CM | POA: Diagnosis not present

## 2022-02-15 DIAGNOSIS — M1990 Unspecified osteoarthritis, unspecified site: Secondary | ICD-10-CM | POA: Diagnosis not present

## 2022-02-15 DIAGNOSIS — K219 Gastro-esophageal reflux disease without esophagitis: Secondary | ICD-10-CM | POA: Diagnosis not present

## 2022-02-15 DIAGNOSIS — Z823 Family history of stroke: Secondary | ICD-10-CM | POA: Diagnosis not present

## 2022-02-15 DIAGNOSIS — R69 Illness, unspecified: Secondary | ICD-10-CM | POA: Diagnosis not present

## 2022-02-15 DIAGNOSIS — R030 Elevated blood-pressure reading, without diagnosis of hypertension: Secondary | ICD-10-CM | POA: Diagnosis not present

## 2022-02-15 DIAGNOSIS — F419 Anxiety disorder, unspecified: Secondary | ICD-10-CM | POA: Diagnosis not present

## 2022-02-15 DIAGNOSIS — Z791 Long term (current) use of non-steroidal anti-inflammatories (NSAID): Secondary | ICD-10-CM | POA: Diagnosis not present

## 2022-02-15 DIAGNOSIS — Z811 Family history of alcohol abuse and dependence: Secondary | ICD-10-CM | POA: Diagnosis not present

## 2022-02-15 DIAGNOSIS — H3530 Unspecified macular degeneration: Secondary | ICD-10-CM | POA: Diagnosis not present

## 2022-02-15 DIAGNOSIS — G4700 Insomnia, unspecified: Secondary | ICD-10-CM | POA: Diagnosis not present

## 2022-02-15 DIAGNOSIS — Z803 Family history of malignant neoplasm of breast: Secondary | ICD-10-CM | POA: Diagnosis not present

## 2022-02-15 DIAGNOSIS — K5900 Constipation, unspecified: Secondary | ICD-10-CM | POA: Diagnosis not present

## 2022-03-07 DIAGNOSIS — H6502 Acute serous otitis media, left ear: Secondary | ICD-10-CM | POA: Diagnosis not present

## 2022-04-04 DIAGNOSIS — H33193 Other retinoschisis and retinal cysts, bilateral: Secondary | ICD-10-CM | POA: Diagnosis not present

## 2022-04-04 DIAGNOSIS — H43393 Other vitreous opacities, bilateral: Secondary | ICD-10-CM | POA: Diagnosis not present

## 2022-04-04 DIAGNOSIS — H43813 Vitreous degeneration, bilateral: Secondary | ICD-10-CM | POA: Diagnosis not present

## 2022-04-04 DIAGNOSIS — H353231 Exudative age-related macular degeneration, bilateral, with active choroidal neovascularization: Secondary | ICD-10-CM | POA: Diagnosis not present

## 2022-04-11 DIAGNOSIS — N83201 Unspecified ovarian cyst, right side: Secondary | ICD-10-CM | POA: Diagnosis not present

## 2022-05-08 DIAGNOSIS — N83201 Unspecified ovarian cyst, right side: Secondary | ICD-10-CM | POA: Diagnosis not present

## 2022-05-08 DIAGNOSIS — G8929 Other chronic pain: Secondary | ICD-10-CM | POA: Diagnosis not present

## 2022-05-08 DIAGNOSIS — M545 Low back pain, unspecified: Secondary | ICD-10-CM | POA: Diagnosis not present

## 2022-05-16 DIAGNOSIS — H43813 Vitreous degeneration, bilateral: Secondary | ICD-10-CM | POA: Diagnosis not present

## 2022-05-16 DIAGNOSIS — H353231 Exudative age-related macular degeneration, bilateral, with active choroidal neovascularization: Secondary | ICD-10-CM | POA: Diagnosis not present

## 2022-06-27 DIAGNOSIS — H353231 Exudative age-related macular degeneration, bilateral, with active choroidal neovascularization: Secondary | ICD-10-CM | POA: Diagnosis not present

## 2022-06-29 DIAGNOSIS — J208 Acute bronchitis due to other specified organisms: Secondary | ICD-10-CM | POA: Diagnosis not present

## 2022-06-29 DIAGNOSIS — I10 Essential (primary) hypertension: Secondary | ICD-10-CM | POA: Diagnosis not present

## 2022-06-29 DIAGNOSIS — Z6836 Body mass index (BMI) 36.0-36.9, adult: Secondary | ICD-10-CM | POA: Diagnosis not present

## 2022-06-29 DIAGNOSIS — B9689 Other specified bacterial agents as the cause of diseases classified elsewhere: Secondary | ICD-10-CM | POA: Diagnosis not present

## 2022-08-15 DIAGNOSIS — H353231 Exudative age-related macular degeneration, bilateral, with active choroidal neovascularization: Secondary | ICD-10-CM | POA: Diagnosis not present

## 2022-08-23 ENCOUNTER — Other Ambulatory Visit: Payer: Self-pay | Admitting: Family Medicine

## 2022-08-23 DIAGNOSIS — Z1231 Encounter for screening mammogram for malignant neoplasm of breast: Secondary | ICD-10-CM

## 2022-08-24 DIAGNOSIS — N83201 Unspecified ovarian cyst, right side: Secondary | ICD-10-CM | POA: Diagnosis not present

## 2022-08-24 DIAGNOSIS — D219 Benign neoplasm of connective and other soft tissue, unspecified: Secondary | ICD-10-CM | POA: Diagnosis not present

## 2022-08-24 DIAGNOSIS — R102 Pelvic and perineal pain: Secondary | ICD-10-CM | POA: Diagnosis not present

## 2022-08-29 ENCOUNTER — Other Ambulatory Visit: Payer: Self-pay

## 2022-08-29 ENCOUNTER — Encounter (HOSPITAL_BASED_OUTPATIENT_CLINIC_OR_DEPARTMENT_OTHER): Payer: Self-pay | Admitting: Obstetrics and Gynecology

## 2022-08-29 DIAGNOSIS — Z01818 Encounter for other preprocedural examination: Secondary | ICD-10-CM | POA: Diagnosis not present

## 2022-08-29 DIAGNOSIS — I491 Atrial premature depolarization: Secondary | ICD-10-CM | POA: Diagnosis not present

## 2022-08-29 NOTE — Progress Notes (Signed)
Spoke w/ via phone for pre-op interview---Kristin Myers needs dos----  none per anesthesia, surgeon orders pending as of 08/29/22             Myers results------08/31/22 Myers appt for cbc, bmp, EKG, type & screen COVID test -----patient states asymptomatic no test needed Arrive at -------0630 on Wednesday, 09/06/22 NPO after MN NO Solid Food.  Clear liquids from MN until---0530 Med rec completed Medications to take morning of surgery -----Amlodipine Diabetic medication -----n/a Patient instructed no nail polish to be worn day of surgery Patient instructed to bring photo id and insurance card day of surgery Patient aware to have Driver (ride ) / caregiver    for 24 hours after surgery - friend, [name] Patient Special Instructions -----Partial denture must be removed prior to OR. Extended / overnight stay instructions given. Pre-Op special Istructions -----Requested orders from Dr. Landry Mellow via Epic IB on 08/29/22. Patient verbalized understanding of instructions that were given at this phone interview. Patient denies shortness of breath, chest pain, fever, cough at this phone interview.

## 2022-08-29 NOTE — Progress Notes (Signed)
Your procedure is scheduled on Wednesday, 09/06/22.  Report to [facility].   Call this number if you have problems the morning of surgery  [phone].   OUR ADDRESS IS [place].  WE ARE LOCATED IN [facility].  PLEASE BRING YOUR INSURANCE CARD AND PHOTO ID DAY OF SURGERY.  ONLY 2 PEOPLE ARE ALLOWED IN  WAITING  ROOM.                                      REMEMBER:  DO NOT EAT FOOD, CANDY GUM OR MINTS  AFTER MIDNIGHT THE NIGHT BEFORE YOUR SURGERY . YOU MAY HAVE CLEAR LIQUIDS FROM MIDNIGHT THE NIGHT BEFORE YOUR SURGERY UNTIL  5:30 AM. NO CLEAR LIQUIDS AFTER   5:30 AM DAY OF SURGERY.  YOU MAY  BRUSH YOUR TEETH MORNING OF SURGERY AND RINSE YOUR MOUTH OUT, NO CHEWING GUM CANDY OR MINTS.     CLEAR LIQUID DIET   Foods Allowed                                                                     Foods Excluded  Coffee and tea, regular and decaf                             liquids that you cannot  Plain Jell-O                                                                   see through such as: Fruit ices (not with fruit pulp)                                     milk, soups, orange juice  Plain  Popsicles                                    All solid food Carbonated beverages, regular and diet                                    Cranberry, grape and apple juices Sports drinks like Gatorade _____________________________________________________________________     TAKE ONLY THESE MEDICATIONS MORNING OF SURGERY: Amlodipine    UP TO 4 VISITORS  MAY VISIT IN THE EXTENDED RECOVERY ROOM UNTIL 800 PM ONLY.  ONE  VISITOR AGE 57 AND OVER MAY SPEND THE NIGHT AND MUST BE IN EXTENDED RECOVERY ROOM NO LATER THAN 800 PM . YOUR DISCHARGE TIME AFTER YOU SPEND THE NIGHT IS 900 AM THE MORNING AFTER YOUR SURGERY.  YOU MAY PACK A SMALL OVERNIGHT BAG WITH TOILETRIES FOR YOUR OVERNIGHT STAY  IF YOU WISH.  YOUR PRESCRIPTION MEDICATIONS WILL BE  PROVIDED DURING [facility].                                      DO NOT WEAR JEWERLY, MAKE UP. DO NOT WEAR LOTIONS, POWDERS, PERFUMES OR NAIL POLISH ON YOUR FINGERNAILS. TOENAIL POLISH IS OK TO WEAR. DO NOT SHAVE FOR 48 HOURS PRIOR TO DAY OF SURGERY. MEN MAY SHAVE FACE AND NECK. CONTACTS, GLASSES, OR DENTURES MAY NOT BE WORN TO SURGERY.  REMEMBER: NO SMOKING, DRUGS OR ALCOHOL FOR 24 HOURS BEFORE YOUR SURGERY.                                    [facility] IS NOT RESPONSIBLE  FOR ANY BELONGINGS.                                                                    Marland Kitchen           [facility] - Preparing for Surgery Before surgery, you can play an important role.  Because skin is not sterile, your skin needs to be as free of germs as possible.  You can reduce the number of germs on your skin by washing with CHG (chlorahexidine gluconate) soap before surgery.  CHG is an antiseptic cleaner which kills germs and bonds with the skin to continue killing germs even after washing. Please DO NOT use if you have an allergy to CHG or antibacterial soaps.  If your skin becomes reddened/irritated stop using the CHG and inform your nurse when you arrive at Short Stay. Do not shave (including legs and underarms) for at least 48 hours prior to the first CHG shower.  You may shave your face/neck. Please follow these instructions carefully:  1.  Shower with CHG Soap the night before surgery and the  morning of Surgery.  2.  If you choose to wash your hair, wash your hair first as usual with your  normal  shampoo.  3.  After you shampoo, rinse your hair and body thoroughly to remove the  shampoo.                                        4.  Use CHG as you would any other liquid soap.  You can apply chg directly  to the skin and wash , chg soap provided, night before and morning of your surgery.  5.  Apply the CHG Soap to your body ONLY FROM THE NECK DOWN.   Do not use on face/ open                           Wound  or open sores. Avoid contact with eyes, ears mouth and genitals (private parts).                       Wash face,  Genitals (private parts) with your normal soap.  6.  Wash thoroughly, paying special attention to the area where your surgery  will be performed.  7.  Thoroughly rinse your body with warm water from the neck down.  8.  DO NOT shower/wash with your normal soap after using and rinsing off  the CHG Soap.             9.  Pat yourself dry with a clean towel.            10.  Wear clean pajamas.            11.  Place clean sheets on your bed the night of your first shower and do not  sleep with pets. Day of Surgery : Do not apply any lotions/deodorants the morning of surgery.  Please wear clean clothes to the hospital/surgery center.  IF YOU HAVE ANY SKIN IRRITATION OR PROBLEMS WITH THE SURGICAL SOAP, PLEASE GET A BAR OF GOLD DIAL SOAP AND SHOWER THE NIGHT BEFORE YOUR SURGERY AND THE MORNING OF YOUR SURGERY. PLEASE LET THE NURSE KNOW MORNING OF YOUR SURGERY IF YOU HAD ANY PROBLEMS WITH THE SURGICAL SOAP.   ________________________________________________________________________                                                        QUESTIONS [facility] PRE OP NURSE PHONE [phone].

## 2022-08-31 ENCOUNTER — Encounter (HOSPITAL_COMMUNITY)
Admission: RE | Admit: 2022-08-31 | Discharge: 2022-08-31 | Disposition: A | Discharge status: Home / Self Care | Payer: Medicare HMO | Source: Ambulatory Visit | Attending: Obstetrics and Gynecology | Admitting: Obstetrics and Gynecology

## 2022-08-31 DIAGNOSIS — I491 Atrial premature depolarization: Secondary | ICD-10-CM | POA: Diagnosis not present

## 2022-08-31 DIAGNOSIS — Z01818 Encounter for other preprocedural examination: Secondary | ICD-10-CM | POA: Diagnosis not present

## 2022-08-31 LAB — CBC
HCT: 39.4 % (ref 36.0–46.0)
Hemoglobin: 12.3 g/dL (ref 12.0–15.0)
MCH: 26.9 pg (ref 26.0–34.0)
MCHC: 31.2 g/dL (ref 30.0–36.0)
MCV: 86.2 fL (ref 80.0–100.0)
Platelets: 306 10*3/uL (ref 150–400)
RBC: 4.57 MIL/uL (ref 3.87–5.11)
RDW: 13.1 % (ref 11.5–15.5)
WBC: 5.5 10*3/uL (ref 4.0–10.5)
nRBC: 0 % (ref 0.0–0.2)

## 2022-08-31 LAB — BASIC METABOLIC PANEL
Anion gap: 10 (ref 5–15)
BUN: 16 mg/dL (ref 8–23)
CO2: 22 mmol/L (ref 22–32)
Calcium: 9.2 mg/dL (ref 8.9–10.3)
Chloride: 106 mmol/L (ref 98–111)
Creatinine, Ser: 0.85 mg/dL (ref 0.44–1.00)
GFR, Estimated: >60 mL/min (ref >60)
Glucose, Bld: 107 mg/dL — ABNORMAL HIGH (ref 70–99)
Potassium: 4.1 mmol/L (ref 3.5–5.1)
Sodium: 138 mmol/L (ref 135–145)

## 2022-09-02 ENCOUNTER — Other Ambulatory Visit: Payer: Self-pay | Admitting: Obstetrics and Gynecology

## 2022-09-02 NOTE — H&P (Deleted)
  The note originally documented on this encounter has been moved the the encounter in which it belongs.

## 2022-09-02 NOTE — H&P (Signed)
Reason for Appointment 1. Preop History of Present Illness General:         67 y/o presents for preop visit. Pt is schedule for a robotic assisted laparoscopic hysterectomy with bilateral salpingoophorectomy on 09/06/2022 for the management of fibroids and right ovarian cyst.        U/S from 04/11/2022: uterus measures 5.4 cm x 2.9 cm x 3.8 cm .        Uterus contains three small fibroids that remain stable in size. Endometrium difficult to clearly visualize; however, it does appear thin (which is normal). Right ovary simple cyst measures 5.4 cm at greatest diameter - this is stable from previous ultrasound 01/04/2022 . Left ovary appears normal.        She has pain in her lower back on the right side that is a constant dull pain. It has increased from a 3-4 to a 6-7 . she takes tylenol without relief.  Current Medications Taking amLODIPine Besylate 5 MG Tablet 1 tablet Orally Once a day EPINEPHrine 0.3 MG/0.3ML Solution Auto-injector as directed Injection for yellow jacket stings Meloxicam 15 MG Tablet Take 1 tablet by mouth once daily Orally Once a day , Notes to Pharmacist: ROSS Melatonin 3 MG Tablet 1 tablet Orally once a day if needed , Notes to Pharmacist: OTC MiraLax(Polyethylene Glycol 3350) 17 GM/SCOOP Powder 1 packet mixed with 8 ounces of fluid Orally once a day if needed , Notes to Pharmacist: EDWARDS Multivitamins 1 tablet Orally daily , Notes to Pharmacist: OTC Esomeprazole Magnesium 40 MG Capsule Delayed Release 1 capsule Orally Once a day Vabysmo(Faricimab-svoa) 6 MG/0.05ML Solution as directed Intravitreal , Notes to Pharmacist: Tye Savoy Potassium 99 MG Tablet 1 tablet Orally Once a day Iron 325 (65 Fe) MG Tablet 1 tablet Orally Once a day Not-Taking Cefdinir 300 MG Capsule 1 capsule Orally twice a day buPROPion HCl ER (XL) 150 MG Tablet Extended Release 24 Hour 1 tablet by mouth every morning , Notes to Pharmacist: ROSS Medication List reviewed and reconciled with the  patient Past Medical History      esophageal reflux/esophageal strictures with dilatation, gastroenterologist Dr. Oletta Lamas.      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      History of depression.      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      Macular degeneration.      Family history of breast cancer in sister at age 68.      Bee sting allergy.      vitamin D deficiency.      R handed.      Hypertension. Surgical History       colonoscopy, 2014 06/09/1998       bilat cataract surgery by Dr Bing Plume 2016       esophageal stretching. 2013? Family History Father: deceased, bleeding disorder of some sort, Mother: deceased, cervical cancer, dementia, osteoporosis, diagnosed with COPD (chronic obstructive pulmonary disease) Sister 1: deceased, diagnosed with Lung Cancer, Breast cancer, COPD (chronic obstructive pulmonary disease) Neg GI family hx-sk. Social History General:  Tobacco use  cigarettes:  Never smoked, Tobacco history last updated  08/24/2022, Vaping  No. EXPOSURE TO PASSIVE SMOKE: no. Alcohol: yes, Rare. Caffeine: yes, decaf or caffeine free. Recreational drug use: no, no. DIET: good, no particular dietary program. Exercise: yes, lots of walking DAILY- 18K+steps. Marital Status: single. Children: Son. OCCUPATION: employed, Educational psychologist. Lives alone. Gyn History Sexual activity not currently sexually active.  Periods : postmenopausal.  LMP age 56, No PMB.  Denies H/O Birth control.  Last pap smear date 02/2016 NILM, HRHPV neg.  Last mammogram date 08/2021.  Denies H/O Abnormal pap smear.  Denies H/O STD.  Menarche 4th grade.  OB History Never been pregnant  per patient.  Allergies Sulfa drugs (for allergy): father allergy-anaphylaxis Bee sting Tramadol HCl: vomiting - Allergy Pneumovax 23: arm swelling - Allergy Latex: anaphylaxis - Allergy - Criticality High Augmentin: Vomiting - Side Effects Hospitalization/Major Diagnostic Procedure dehydration in the 1980s 2001  obstipation Review of Systems CONSTITUTIONAL:         Chills No.  Fatigue No.  Fever No.  Night sweats No.  Recent travel outside [place] No.  Sweats No.  Weight change No.     OPHTHALMOLOGY:         Blurring of vision no.  Change in vision no.  Double vision no.     ENT:         Dizziness no.  Nose bleeds no.  Sore throat no.  Teeth pain no.     ALLERGY:         Hives no.     CARDIOLOGY:         Chest pain no.  High blood pressure no.  Irregular heart beat no.  Leg edema no.  Palpitations no.     RESPIRATORY:         Shortness of breath no.  Cough no.  Wheezing no.     UROLOGY:         Pain with urination no.  Urinary urgency no.  Urinary frequency no.  Urinary incontinence no.  Difficulty urinating No.  Blood in urine No.     GASTROENTEROLOGY:         Abdominal pain no.  Appetite change no.  Bloating/belching no.  Blood in stool or on toilet paper no.  Change in bowel movements no.  Constipation no.  Diarrhea no.  Difficulty swallowing no.  Nausea no.     FEMALE REPRODUCTIVE:         Vulvar pain no.  Vulvar rash no.  Abnormal vaginal bleeding no.  Breast pain no.  Nipple discharge no.  Pain with intercourse no.  Pelvic pain , continuous.  Unusual vaginal discharge no.  Vaginal itching no.     MUSCULOSKELETAL:         Muscle aches no.     NEUROLOGY:         Headache no.  Tingling/numbness no.  Weakness no.     PSYCHOLOGY:         Depression no.  Anxiety no.  Nervousness no.  Sleep disturbances no.  Suicidal ideation no .     ENDOCRINOLOGY:         Excessive thirst no.  Excessive urination no.  Hair loss no.  Heat or cold intolerance no.     HEMATOLOGY/LYMPH:         Abnormal bleeding no.  Easy bruising no.  Swollen glands no.     DERMATOLOGY:         New/changing skin lesion no.  Rash no.  Sores no.     Vital Signs Wt: 183.2, Wt change: -2.4 lbs, Ht: 59.75, BMI: 36.07, Pulse sitting: 72, BP sitting: 121/80. Examination General Examination:        CONSTITUTIONAL: alert, oriented, NAD.  SKIN:  moist, warm. EYES:  Conjunctiva clear. LUNGS: good I:E efffort noted, clear to auscultation bilaterally. HEART: regular rate and rhythm. ABDOMEN: soft, non-tender/non-distended, bowel sounds present. FEMALE GENITOURINARY: normal external  genitalia, labia - unremarkable, vagina - pink moist mucosa, no lesions or abnormal discharge, cervix - no discharge or lesions or CMT, adnexa - no masses or tenderness, uterus - nontender and normal size on palpation. PSYCH:  affect normal, good eye contact.  Physical Examination Chaperone present:         Chaperone present  Durward Fortes, [place] 08/24/2022 09:50:52 AM >, for pelvic exam.      Assessments 1. Fibroids - D21.9 (Primary) 2. Right ovarian cyst - N83.201 3. Pelvic pain - R10.2 Treatment 1. Fibroids  Notes: Notes: r/b/a of robotic assisted laparoscopic hysterectomy with bilateral salpingo0-oophorectomy discussed with the patient. including but not limited to infection/ bleeding / damage to bowel bladder ureters as well as conversion to laparotomy with the need for further surgery. pt voiced understanding and would like to proceed with robotic assisted laparoscopic hysterectomy with bilateral salpingectomy.. r/o trasnfusion discussed. HIV/ HEP B and C.. she is advised to avoid NSAIDS between now and surgery. she is advised not to eat or drink after midninght the night prior to surgery.Marland Kitchen she will not be able to drive for 1 week after surgery. she will need to avoid heavy lifting over 10 lbs and sex for at least 6 weeks after surgery    2. Right ovarian cyst  Notes: Notes: r/b/a of robotic assisted laparoscopic hysterectomy with bilateral salpingo0-oophorectomy discussed with the patient. including but not limited to infection/ bleeding / damage to bowel bladder ureters as well as conversion to laparotomy with the need for further surgery. pt voiced understanding and would like to proceed with robotic assisted laparoscopic hysterectomy with bilateral  salpingectomy.. r/o trasnfusion discussed. HIV/ HEP B and C.. she is advised to avoid NSAIDS between now and surgery. she is advised not to eat or drink after midninght the night prior to surgery.Marland Kitchen she will not be able to drive for 1 week after surgery. she will need to avoid heavy lifting over 10 lbs and sex for at least 6 weeks after surgery    3. Pelvic pain  Notes: Marland Kitchen. pt is advised if pain is not from pelvic organs it may persist after surgery...  Notes: r/b/a of robotic assisted laparoscopic hysterectomy with bilateral salpingo0-oophorectomy discussed with the patient. including but not limited to infection/ bleeding / damage to bowel bladder ureters as well as conversion to laparotomy with the need for further surgery. pt voiced understanding and would like to proceed with robotic assisted laparoscopic hysterectomy with bilateral salpingectomy.. r/o trasnfusion discussed. HIV/ HEP B and C.. she is advised to avoid NSAIDS between now and surgery. she is advised not to eat or drink after midninght the night prior to surgery.Marland Kitchen she will not be able to drive for 1 week after surgery. she will need to avoid heavy lifting over 10 lbs and sex for at least 6 weeks after surgery

## 2022-09-06 ENCOUNTER — Other Ambulatory Visit: Payer: Self-pay

## 2022-09-06 ENCOUNTER — Observation Stay (HOSPITAL_BASED_OUTPATIENT_CLINIC_OR_DEPARTMENT_OTHER): Payer: Medicare HMO | Admitting: Anesthesiology

## 2022-09-06 ENCOUNTER — Observation Stay (HOSPITAL_BASED_OUTPATIENT_CLINIC_OR_DEPARTMENT_OTHER)
Admission: RE | Admit: 2022-09-06 | Discharge: 2022-09-06 | Disposition: A | Discharge status: Home / Self Care | Payer: Medicare HMO | Attending: Obstetrics and Gynecology | Admitting: Obstetrics and Gynecology

## 2022-09-06 ENCOUNTER — Encounter (HOSPITAL_BASED_OUTPATIENT_CLINIC_OR_DEPARTMENT_OTHER)
Admission: RE | Disposition: A | Discharge status: Home / Self Care | Payer: Self-pay | Source: Home / Self Care | Attending: Obstetrics and Gynecology

## 2022-09-06 ENCOUNTER — Encounter (HOSPITAL_BASED_OUTPATIENT_CLINIC_OR_DEPARTMENT_OTHER): Payer: Self-pay | Admitting: Obstetrics and Gynecology

## 2022-09-06 DIAGNOSIS — D250 Submucous leiomyoma of uterus: Secondary | ICD-10-CM | POA: Diagnosis not present

## 2022-09-06 DIAGNOSIS — D219 Benign neoplasm of connective and other soft tissue, unspecified: Secondary | ICD-10-CM | POA: Diagnosis present

## 2022-09-06 DIAGNOSIS — D282 Benign neoplasm of uterine tubes and ligaments: Secondary | ICD-10-CM | POA: Insufficient documentation

## 2022-09-06 DIAGNOSIS — D259 Leiomyoma of uterus, unspecified: Secondary | ICD-10-CM | POA: Diagnosis not present

## 2022-09-06 DIAGNOSIS — D251 Intramural leiomyoma of uterus: Secondary | ICD-10-CM | POA: Diagnosis not present

## 2022-09-06 DIAGNOSIS — Z90710 Acquired absence of both cervix and uterus: Secondary | ICD-10-CM | POA: Diagnosis present

## 2022-09-06 DIAGNOSIS — Z79899 Other long term (current) drug therapy: Secondary | ICD-10-CM | POA: Insufficient documentation

## 2022-09-06 DIAGNOSIS — N858 Other specified noninflammatory disorders of uterus: Secondary | ICD-10-CM | POA: Diagnosis not present

## 2022-09-06 DIAGNOSIS — D270 Benign neoplasm of right ovary: Secondary | ICD-10-CM | POA: Insufficient documentation

## 2022-09-06 DIAGNOSIS — N83201 Unspecified ovarian cyst, right side: Secondary | ICD-10-CM

## 2022-09-06 DIAGNOSIS — D271 Benign neoplasm of left ovary: Secondary | ICD-10-CM | POA: Diagnosis not present

## 2022-09-06 DIAGNOSIS — Z01818 Encounter for other preprocedural examination: Secondary | ICD-10-CM

## 2022-09-06 HISTORY — DX: Anemia, unspecified: D64.9

## 2022-09-06 HISTORY — DX: Unspecified macular degeneration: H35.30

## 2022-09-06 HISTORY — DX: Presence of dental prosthetic device (complete) (partial): Z97.2

## 2022-09-06 HISTORY — DX: Family history of ischemic heart disease and other diseases of the circulatory system: Z82.49

## 2022-09-06 HISTORY — PX: ROBOTIC ASSISTED LAPAROSCOPIC HYSTERECTOMY AND SALPINGECTOMY: SHX6379

## 2022-09-06 HISTORY — DX: Essential (primary) hypertension: I10

## 2022-09-06 HISTORY — DX: Ileus, unspecified: K56.7

## 2022-09-06 HISTORY — DX: Personal history of urinary calculi: Z87.442

## 2022-09-06 HISTORY — DX: Presence of spectacles and contact lenses: Z97.3

## 2022-09-06 LAB — ABO/RH: ABO/RH(D): A POS

## 2022-09-06 LAB — TYPE AND SCREEN
ABO/RH(D): A POS
Antibody Screen: NEGATIVE

## 2022-09-06 SURGERY — XI ROBOTIC ASSISTED LAPAROSCOPIC HYSTERECTOMY AND SALPINGECTOMY
Anesthesia: General | Site: Pelvis | Laterality: Bilateral

## 2022-09-06 MED ORDER — GABAPENTIN 100 MG PO CAPS: 100.0000 mg | ORAL_CAPSULE | Freq: Two times a day (BID) | ORAL | Status: DC

## 2022-09-06 MED ORDER — PROPOFOL 10 MG/ML IV BOLUS
INTRAVENOUS | Status: DC | PRN
  Administered 2022-09-06: 120 mg via INTRAVENOUS

## 2022-09-06 MED ORDER — HEMOSTATIC AGENTS (NO CHARGE) OPTIME
TOPICAL | Status: DC | PRN
  Administered 2022-09-06: 1

## 2022-09-06 MED ORDER — FENTANYL CITRATE (PF) 250 MCG/5ML IJ SOLN
INTRAMUSCULAR | Status: AC
  Filled 2022-09-06: qty 5

## 2022-09-06 MED ORDER — GABAPENTIN 300 MG PO CAPS
300.0000 mg | ORAL_CAPSULE | ORAL | Status: AC
  Administered 2022-09-06: 300 mg via ORAL

## 2022-09-06 MED ORDER — KETOROLAC TROMETHAMINE 30 MG/ML IJ SOLN
INTRAMUSCULAR | Status: DC | PRN
  Administered 2022-09-06: 30 mg via INTRAVENOUS

## 2022-09-06 MED ORDER — ONDANSETRON HCL 4 MG PO TABS: 4.0000 mg | ORAL_TABLET | Freq: Four times a day (QID) | ORAL | Status: DC | PRN

## 2022-09-06 MED ORDER — PHENYLEPHRINE 80 MCG/ML (10ML) SYRINGE FOR IV PUSH (FOR BLOOD PRESSURE SUPPORT)
PREFILLED_SYRINGE | INTRAVENOUS | Status: DC | PRN
  Administered 2022-09-06 (×3): 80 ug via INTRAVENOUS

## 2022-09-06 MED ORDER — HYDROMORPHONE HCL 1 MG/ML IJ SOLN: 0.2000 mg | INTRAMUSCULAR | Status: DC | PRN

## 2022-09-06 MED ORDER — SODIUM CHLORIDE 0.9 % IR SOLN
Status: DC | PRN
  Administered 2022-09-06: 1000 mL
  Administered 2022-09-06: 150 mL

## 2022-09-06 MED ORDER — SODIUM CHLORIDE 0.9 % IV SOLN
INTRAVENOUS | Status: AC
  Filled 2022-09-06: qty 2

## 2022-09-06 MED ORDER — LACTATED RINGERS IV SOLN
INTRAVENOUS | Status: DC
  Administered 2022-09-06: 1000 mL via INTRAVENOUS

## 2022-09-06 MED ORDER — LIDOCAINE-EPINEPHRINE 1 %-1:100000 IJ SOLN
INTRAMUSCULAR | Status: DC | PRN
  Administered 2022-09-06: 10 mL

## 2022-09-06 MED ORDER — METHYLENE BLUE 1 % INJ SOLN
INTRAVENOUS | Status: DC | PRN
  Administered 2022-09-06: 2 mL

## 2022-09-06 MED ORDER — FENTANYL CITRATE (PF) 100 MCG/2ML IJ SOLN: 25.0000 ug | INTRAMUSCULAR | Status: DC | PRN

## 2022-09-06 MED ORDER — OXYCODONE HCL 5 MG PO TABS: 5.0000 mg | ORAL_TABLET | ORAL | Status: DC | PRN

## 2022-09-06 MED ORDER — ACETAMINOPHEN 500 MG PO TABS
1000.0000 mg | ORAL_TABLET | Freq: Four times a day (QID) | ORAL | Status: DC
  Administered 2022-09-06: 1000 mg via ORAL

## 2022-09-06 MED ORDER — PANTOPRAZOLE SODIUM 40 MG PO TBEC
40.0000 mg | DELAYED_RELEASE_TABLET | Freq: Every day | ORAL | Status: DC
  Administered 2022-09-06: 40 mg via ORAL

## 2022-09-06 MED ORDER — SODIUM CHLORIDE 0.9 % IV SOLN
INTRAVENOUS | Status: DC | PRN
  Administered 2022-09-06: 100 mL

## 2022-09-06 MED ORDER — SENNA 8.6 MG PO TABS
ORAL_TABLET | ORAL | Status: AC
  Filled 2022-09-06: qty 1

## 2022-09-06 MED ORDER — EPHEDRINE SULFATE-NACL 50-0.9 MG/10ML-% IV SOSY
PREFILLED_SYRINGE | INTRAVENOUS | Status: DC | PRN
  Administered 2022-09-06: 10 mg via INTRAVENOUS

## 2022-09-06 MED ORDER — POVIDONE-IODINE 10 % EX SWAB
2.0000 | Freq: Once | CUTANEOUS | Status: AC
  Administered 2022-09-06: 2 via TOPICAL

## 2022-09-06 MED ORDER — IBUPROFEN 200 MG PO TABS: 600.0000 mg | ORAL_TABLET | Freq: Four times a day (QID) | ORAL | Status: DC

## 2022-09-06 MED ORDER — SENNA 8.6 MG PO TABS
1.0000 | ORAL_TABLET | Freq: Two times a day (BID) | ORAL | Status: DC
  Administered 2022-09-06: 8.6 mg via ORAL

## 2022-09-06 MED ORDER — OXYCODONE HCL 5 MG PO TABS: 5.0000 mg | ORAL_TABLET | Freq: Four times a day (QID) | ORAL | 0 refills | Status: DC | PRN

## 2022-09-06 MED ORDER — MENTHOL 3 MG MT LOZG: 1.0000 | LOZENGE | OROMUCOSAL | Status: DC | PRN

## 2022-09-06 MED ORDER — AMISULPRIDE (ANTIEMETIC) 5 MG/2ML IV SOLN: 10.0000 mg | Freq: Once | INTRAVENOUS | Status: DC | PRN

## 2022-09-06 MED ORDER — SIMETHICONE 80 MG PO CHEW: 80.0000 mg | CHEWABLE_TABLET | Freq: Four times a day (QID) | ORAL | Status: DC | PRN

## 2022-09-06 MED ORDER — MIDAZOLAM HCL 2 MG/2ML IJ SOLN
INTRAMUSCULAR | Status: DC | PRN
  Administered 2022-09-06: 2 mg via INTRAVENOUS

## 2022-09-06 MED ORDER — DEXAMETHASONE SODIUM PHOSPHATE 10 MG/ML IJ SOLN
INTRAMUSCULAR | Status: DC | PRN
  Administered 2022-09-06: 5 mg via INTRAVENOUS

## 2022-09-06 MED ORDER — PANTOPRAZOLE SODIUM 40 MG PO TBEC
DELAYED_RELEASE_TABLET | ORAL | Status: AC
  Filled 2022-09-06: qty 1

## 2022-09-06 MED ORDER — SODIUM CHLORIDE 0.9 % IV SOLN
2.0000 g | INTRAVENOUS | Status: AC
  Administered 2022-09-06: 2 g via INTRAVENOUS

## 2022-09-06 MED ORDER — FENTANYL CITRATE (PF) 250 MCG/5ML IJ SOLN
INTRAMUSCULAR | Status: DC | PRN
  Administered 2022-09-06 (×2): 25 ug via INTRAVENOUS
  Administered 2022-09-06: 50 ug via INTRAVENOUS
  Administered 2022-09-06 (×2): 25 ug via INTRAVENOUS
  Administered 2022-09-06 (×2): 50 ug via INTRAVENOUS

## 2022-09-06 MED ORDER — ROCURONIUM BROMIDE 10 MG/ML (PF) SYRINGE
PREFILLED_SYRINGE | INTRAVENOUS | Status: DC | PRN
  Administered 2022-09-06: 20 mg via INTRAVENOUS
  Administered 2022-09-06: 10 mg via INTRAVENOUS
  Administered 2022-09-06: 50 mg via INTRAVENOUS
  Administered 2022-09-06: 20 mg via INTRAVENOUS

## 2022-09-06 MED ORDER — PROPOFOL 10 MG/ML IV BOLUS
INTRAVENOUS | Status: AC
  Filled 2022-09-06: qty 20

## 2022-09-06 MED ORDER — ONDANSETRON HCL 4 MG/2ML IJ SOLN: 4.0000 mg | Freq: Four times a day (QID) | INTRAMUSCULAR | Status: DC | PRN

## 2022-09-06 MED ORDER — LACTATED RINGERS IV SOLN: INTRAVENOUS | Status: DC

## 2022-09-06 MED ORDER — KETOROLAC TROMETHAMINE 30 MG/ML IJ SOLN: 30.0000 mg | Freq: Once | INTRAMUSCULAR | Status: DC

## 2022-09-06 MED ORDER — DIPHENHYDRAMINE HCL 50 MG/ML IJ SOLN
INTRAMUSCULAR | Status: DC | PRN
  Administered 2022-09-06: 12.5 mg via INTRAVENOUS

## 2022-09-06 MED ORDER — IBUPROFEN 600 MG PO TABS: 600.0000 mg | ORAL_TABLET | Freq: Four times a day (QID) | ORAL | 1 refills | Status: AC | PRN

## 2022-09-06 MED ORDER — SUGAMMADEX SODIUM 200 MG/2ML IV SOLN
INTRAVENOUS | Status: DC | PRN
  Administered 2022-09-06: 200 mg via INTRAVENOUS

## 2022-09-06 MED ORDER — GABAPENTIN 300 MG PO CAPS
ORAL_CAPSULE | ORAL | Status: AC
  Filled 2022-09-06: qty 1

## 2022-09-06 MED ORDER — ACETAMINOPHEN 500 MG PO TABS
1000.0000 mg | ORAL_TABLET | ORAL | Status: AC
  Administered 2022-09-06: 1000 mg via ORAL

## 2022-09-06 MED ORDER — LIDOCAINE 2% (20 MG/ML) 5 ML SYRINGE
INTRAMUSCULAR | Status: DC | PRN
  Administered 2022-09-06: 40 mg via INTRAVENOUS

## 2022-09-06 MED ORDER — ALUM & MAG HYDROXIDE-SIMETH 200-200-20 MG/5ML PO SUSP: 30.0000 mL | ORAL | Status: DC | PRN

## 2022-09-06 MED ORDER — ACETAMINOPHEN 500 MG PO TABS
ORAL_TABLET | ORAL | Status: AC
  Filled 2022-09-06: qty 2

## 2022-09-06 MED ORDER — ACETAMINOPHEN 500 MG PO TABS: 1000.0000 mg | ORAL_TABLET | Freq: Three times a day (TID) | ORAL | 0 refills | Status: AC | PRN

## 2022-09-06 MED ORDER — ONDANSETRON HCL 4 MG/2ML IJ SOLN
INTRAMUSCULAR | Status: DC | PRN
  Administered 2022-09-06: 4 mg via INTRAVENOUS

## 2022-09-06 MED ORDER — MIDAZOLAM HCL 2 MG/2ML IJ SOLN
INTRAMUSCULAR | Status: AC
  Filled 2022-09-06: qty 2

## 2022-09-06 SURGICAL SUPPLY — 82 items
ADH SKN CLS APL DERMABOND .7 (GAUZE/BANDAGES/DRESSINGS) ×1
APL SRG 38 LTWT LNG FL B (MISCELLANEOUS) ×1
APPLICATOR ARISTA FLEXITIP XL (MISCELLANEOUS) IMPLANT
BARRIER ADHS 3X4 INTERCEED (GAUZE/BANDAGES/DRESSINGS) IMPLANT
BLADE SURG 15 STRL LF DISP TIS (BLADE) IMPLANT
BLADE SURG 15 STRL SS (BLADE) ×1
BRR ADH 4X3 ABS CNTRL BYND (GAUZE/BANDAGES/DRESSINGS)
CANNULA CAP OBTURATR AIRSEAL 8 (CAP) IMPLANT
CATH FOLEY 3WAY  5CC 16FR (CATHETERS) ×1
CATH FOLEY 3WAY 5CC 16FR (CATHETERS) ×1 IMPLANT
CELLS DAT CNTRL 66122 CELL SVR (MISCELLANEOUS) IMPLANT
COVER BACK TABLE 60X90IN (DRAPES) ×1 IMPLANT
COVER TIP SHEARS 8 DVNC (MISCELLANEOUS) ×1 IMPLANT
COVER TIP SHEARS 8MM DA VINCI (MISCELLANEOUS) ×1
DEFOGGER SCOPE WARMER CLEARIFY (MISCELLANEOUS) ×1 IMPLANT
DERMABOND ADVANCED .7 DNX12 (GAUZE/BANDAGES/DRESSINGS) ×1 IMPLANT
DILATOR CANAL MILEX (MISCELLANEOUS) ×1 IMPLANT
DRAPE ARM DVNC X/XI (DISPOSABLE) ×4 IMPLANT
DRAPE COLUMN DVNC XI (DISPOSABLE) ×1 IMPLANT
DRAPE DA VINCI XI ARM (DISPOSABLE) ×4
DRAPE DA VINCI XI COLUMN (DISPOSABLE) ×1
DRAPE SHEET LG 3/4 BI-LAMINATE (DRAPES) IMPLANT
DRAPE SURG IRRIG POUCH 19X23 (DRAPES) ×1 IMPLANT
DRAPE UTILITY 15X26 TOWEL STRL (DRAPES) ×1 IMPLANT
DURAPREP 26ML APPLICATOR (WOUND CARE) ×1 IMPLANT
ELECT REM PT RETURN 9FT ADLT (ELECTROSURGICAL) ×1
ELECTRODE REM PT RTRN 9FT ADLT (ELECTROSURGICAL) ×1 IMPLANT
GAUZE 4X4 16PLY ~~LOC~~+RFID DBL (SPONGE) ×2 IMPLANT
GLOVE BIOGEL M 6.5 STRL (GLOVE) ×3 IMPLANT
GLOVE BIOGEL M 7.0 STRL (GLOVE) ×3 IMPLANT
GLOVE BIOGEL PI IND STRL 6.5 (GLOVE) ×6 IMPLANT
GLOVE BIOGEL PI IND STRL 7.0 (GLOVE) ×6 IMPLANT
GYRUS RUMI II 2.5CM BLUE (DISPOSABLE) ×1
HEMOSTAT ARISTA ABSORB 3G PWDR (HEMOSTASIS) IMPLANT
HOLDER FOLEY CATH W/STRAP (MISCELLANEOUS) IMPLANT
IRRIG SUCT STRYKERFLOW 2 WTIP (MISCELLANEOUS) ×1
IRRIGATION SUCT STRKRFLW 2 WTP (MISCELLANEOUS) ×1 IMPLANT
IV SET ADMIN PUMP GEMINI W/NLD (IV SETS) IMPLANT
KIT PINK PAD W/HEAD ARE REST (MISCELLANEOUS) ×1
KIT PINK PAD W/HEAD ARM REST (MISCELLANEOUS) ×1 IMPLANT
KIT TURNOVER CYSTO (KITS) ×1 IMPLANT
LEGGING LITHOTOMY PAIR STRL (DRAPES) ×1 IMPLANT
MANIFOLD NEPTUNE II (INSTRUMENTS) ×1 IMPLANT
OBTURATOR OPTICAL STANDARD 8MM (TROCAR) ×1
OBTURATOR OPTICAL STND 8 DVNC (TROCAR) ×1
OBTURATOR OPTICALSTD 8 DVNC (TROCAR) ×1 IMPLANT
OCCLUDER COLPOPNEUMO (BALLOONS) ×1 IMPLANT
PACK ROBOT WH (CUSTOM PROCEDURE TRAY) ×1 IMPLANT
PACK ROBOTIC GOWN (GOWN DISPOSABLE) ×1 IMPLANT
PAD OB MATERNITY 4.3X12.25 (PERSONAL CARE ITEMS) ×1 IMPLANT
PAD PREP 24X48 CUFFED NSTRL (MISCELLANEOUS) ×1 IMPLANT
PROTECTOR NERVE ULNAR (MISCELLANEOUS) ×1 IMPLANT
RETRACTOR WND ALEXIS 18 MED (MISCELLANEOUS) IMPLANT
RTRCTR WOUND ALEXIS 18CM MED (MISCELLANEOUS)
RTRCTR WOUND ALEXIS 18CM SML (INSTRUMENTS)
RUMI II GYRUS 2.5CM BLUE (DISPOSABLE) IMPLANT
SAVER CELL AAL HAEMONETICS (INSTRUMENTS) IMPLANT
SCISSORS LAP 5X45 EPIX DISP (ENDOMECHANICALS) IMPLANT
SEAL CANN UNIV 5-8 DVNC XI (MISCELLANEOUS) ×4 IMPLANT
SEAL XI 5MM-8MM UNIVERSAL (MISCELLANEOUS) ×4
SEALER VESSEL DA VINCI XI (MISCELLANEOUS)
SEALER VESSEL EXT DVNC XI (MISCELLANEOUS) IMPLANT
SET IRRIG Y TYPE TUR BLADDER L (SET/KITS/TRAYS/PACK) IMPLANT
SET TRI-LUMEN FLTR TB AIRSEAL (TUBING) ×1 IMPLANT
SET TUBE FILTERED XL AIRSEAL (SET/KITS/TRAYS/PACK) IMPLANT
SPIKE FLUID TRANSFER (MISCELLANEOUS) ×2 IMPLANT
SPONGE T-LAP 4X18 ~~LOC~~+RFID (SPONGE) ×1 IMPLANT
SUT VIC AB 0 CT1 27 (SUTURE) ×2
SUT VIC AB 0 CT1 27XBRD ANBCTR (SUTURE) ×2 IMPLANT
SUT VIC AB 0 CT1 36 (SUTURE) IMPLANT
SUT VICRYL 0 UR6 27IN ABS (SUTURE) IMPLANT
SUT VICRYL RAPIDE 4/0 PS 2 (SUTURE) ×3 IMPLANT
SUT VLOC 180 0 9IN  GS21 (SUTURE) ×1
SUT VLOC 180 0 9IN GS21 (SUTURE) ×1 IMPLANT
TIP RUMI ORANGE 6.7MMX12CM (TIP) IMPLANT
TIP UTERINE 5.1X6CM LAV DISP (MISCELLANEOUS) IMPLANT
TIP UTERINE 6.7X10CM GRN DISP (MISCELLANEOUS) IMPLANT
TIP UTERINE 6.7X6CM WHT DISP (MISCELLANEOUS) IMPLANT
TIP UTERINE 6.7X8CM BLUE DISP (MISCELLANEOUS) IMPLANT
TOWEL OR 17X26 10 PK STRL BLUE (TOWEL DISPOSABLE) ×1 IMPLANT
TROCAR PORT AIRSEAL 8X120 (TROCAR) ×1 IMPLANT
WATER STERILE IRR 1000ML POUR (IV SOLUTION) ×1 IMPLANT

## 2022-09-06 NOTE — Anesthesia Preprocedure Evaluation (Signed)
Anesthesia Evaluation  Patient identified by MRN, date of birth, ID band Patient awake    Reviewed: Allergy & Precautions, NPO status , Patient's Chart, lab work & pertinent test results  Airway Mallampati: II  TM Distance: >3 FB Neck ROM: Full    Dental  (+) Dental Advisory Given   Pulmonary neg pulmonary ROS   breath sounds clear to auscultation       Cardiovascular hypertension, Pt. on medications  Rhythm:Regular Rate:Normal     Neuro/Psych  Headaches    GI/Hepatic Neg liver ROS,GERD  ,,  Endo/Other  negative endocrine ROS    Renal/GU negative Renal ROS     Musculoskeletal   Abdominal   Peds  Hematology  (+) Blood dyscrasia, anemia   Anesthesia Other Findings   Reproductive/Obstetrics                             Anesthesia Physical Anesthesia Plan  ASA: 2  Anesthesia Plan: General   Post-op Pain Management: Tylenol PO (pre-op)*, Gabapentin PO (pre-op)* and Toradol IV (intra-op)*   Induction: Intravenous  PONV Risk Score and Plan: 3 and Dexamethasone, Ondansetron, Midazolam and Treatment may vary due to age or medical condition  Airway Management Planned: Oral ETT  Additional Equipment:   Intra-op Plan:   Post-operative Plan: Extubation in OR  Informed Consent: I have reviewed the patients History and Physical, chart, labs and discussed the procedure including the risks, benefits and alternatives for the proposed anesthesia with the patient or authorized representative who has indicated his/her understanding and acceptance.     Dental advisory given  Plan Discussed with: CRNA  Anesthesia Plan Comments:        Anesthesia Quick Evaluation

## 2022-09-06 NOTE — H&P (Signed)
Date of Initial H&P: 09/02/2022  History reviewed, patient examined, no change in status, stable for surgery.

## 2022-09-06 NOTE — Transfer of Care (Signed)
Immediate Anesthesia Transfer of Care Note  Patient: Kristin Myers  Procedure(s) Performed: XI ROBOTIC ASSISTED LAPAROSCOPIC HYSTERECTOMY AND BILATERAL SALPINGO-OOPHORECTOMY (Bilateral: Pelvis)  Patient Location: PACU  Anesthesia Type:General  Level of Consciousness: awake, alert , oriented, and patient cooperative  Airway & Oxygen Therapy: Patient Spontanous Breathing  Post-op Assessment: Report given to RN and Post -op Vital signs reviewed and stable  Post vital signs: Reviewed and stable  Last Vitals:  Vitals Value Taken Time  BP 120/79 09/06/22 1130  Temp 36.7 C 09/06/22 1130  Pulse 98 09/06/22 1132  Resp 18 09/06/22 1132  SpO2 97 % 09/06/22 1132  Vitals shown include unvalidated device data.  Last Pain:  Vitals:   09/06/22 0704  TempSrc:   PainSc: 2       Patients Stated Pain Goal: 5 (94/80/16 5537)  Complications: No notable events documented.

## 2022-09-06 NOTE — Progress Notes (Signed)
Tc to Dr. Landry Mellow d/t patient requesting D/C home, has met all goals required for discharge. Dr. Landry Mellow will complete AVS/discharge within next hour. Pt and family member at bedside informed, verbalized understanding. Discharge teaching provided.   Lyndel Pleasure, RN

## 2022-09-06 NOTE — Op Note (Signed)
09/06/2022  11:23 AM  PATIENT:  Kristin Myers  67 y.o. female  PRE-OPERATIVE DIAGNOSIS:  Fibroids  Right Ovarian Cyst  POST-OPERATIVE DIAGNOSIS:  Fibroids Right Ovarian Cyst  PROCEDURE:  Procedure(s): XI ROBOTIC ASSISTED LAPAROSCOPIC HYSTERECTOMY AND BILATERAL SALPINGO-OOPHORECTOMY (Bilateral)  SURGEON:  Surgeon(s) and Role:    Christophe Louis, MD - Primary  PHYSICIAN ASSISTANT: None  ASSISTANTS: Gaylord Shih RNFA assisted due to complexity of the anatomy   ANESTHESIA:   general  EBL:  20 mL   BLOOD ADMINISTERED:none  DRAINS: Urinary Catheter (Foley)   LOCAL MEDICATIONS USED:  OTHER ropivacaine and lidocaine with epi   SPECIMEN:  Source of Specimen:  Uterus cervix and bilateral fallopian tubes and ovaries   DISPOSITION OF SPECIMEN:  PATHOLOGY  COUNTS:  YES  TOURNIQUET:  * No tourniquets in log *  DICTATION: .Note written in EPIC  PLAN OF CARE: Admit for overnight observation  PATIENT DISPOSITION:  PACU - hemodynamically stable.   Delay start of Pharmacological VTE agent (>24 hrs) due to surgical blood loss or risk of bleeding: not applicable  Findings: agglutination of the labia minora to the ipsilateral labia majora.  very narrow vaginal vault. Atrophic vaginal mucosa and cervix.Marland Kitchen normal appearing uterus. Normal fallopian tubes bilaterally. Normal left ovary. 4 cm simple cyst on the right ovary.   Procedure: The patient was taken to the operating room where she was placed under general anesthesia.Time out was performed. Marland Kitchen She was placed in dorsal lithotomy position and prepped and draped in the usual sterile fashion. A weighted speculum was placed into the vagina. A Deaver was placed anteriorly for retraction. The anterior lip of the cervix was grasped with a single-tooth tenaculum. The vaginal mucosa was injected with 2.5 cc of ropivacaine at the 2/4/ 8 and 10 o'clock positions. The uterus was sounded to 8 cm. the cervix was dilated to 6 mm . 0 vicryl suture  placed at the 12 and 6:00 positions Of the cervix to facilitate placement of a Ru mi uterine manipulator. The 2.5 cm koh manipulator was too large for the vaginal vault. It secured to the cervix with suture and remained at the introitus of the vaginal vault.  Attention was turned to the patient's abdomen where a 8 mm trocar was at  the umbilicus. under direct visualization . The pneumoperitoneum was achieved with PCO2 gas. The laparoscope was removed. 60 cc of ropivacaine were injected into the abdominal cavity. The laparoscope was reinserted. An 8 mm trocar was placed in the right upper quadrant 16 centimeters from the umbilicus.later connected to robotic arm #3).  An 8 mm incision was made in the left upper quadrant 16 cm from the umbilicus and connected to robot arm #1. Once all ports had been placed under direct visualization.The laparoscope was removed and the [place] robotic system was thin right-sided docked. The robotic arms were connected to the corresponding trocars as listed above. The laparoscope was then reinserted. The long tip bipolar forceps were placed into port #1. A vessel sealer ( alternating with laparoscopic scissors) was placed in port #3. All instruments were directed into the pelvis under direct visualization.  Attention was turned to the surgeons console.. The left infundibulo-pelvic ligament was cauterized and transected with the vessel sealer The broad ligament was cauterized and transected with the vessel sealer .The round ligament was cauterized and transected with the vessel sealer  The anterior leaf of broad ligament was incised along the bladder reflection to the midline.  The right  infundibulo-pelvic ligament was cauterized and transected with the vessel sealer. The right broad ligament was cauterized and transected with the vessel sealer. The right round ligament was cauterized and transected with the vessel sealer The broad ligament was incised to the midline. The bladder was  dissected off the lower uterine segments of the cervix via sharp and blunt dissection.   The uterine arteries were skeleton bilaterally. They were  cauterized and transected with the vessel sealer.   Attention was turned to the vagina. The koh ring and manipulator was removed. The cervix was incised circumferentially with Bovie.  The anterior colpotomy was performed followed by the posterior colpotomy. Once the uterus,cervix and bilateral fallopian tubes were completely excised was removed through the vagina.  Attention was turned back to the abdomen and pelvis.  The  bipolar forceps and scissors were removed and cobra forceps  were placed in the port #1 and the mega needle driver was placed in to port #3.  The vaginal cuff was closed with running suture if 0 v-lock. The pelvis was irrigated. Marland KitchenMarland KitchenMarland KitchenExcellent hemostasis was noted. Arista was placed along the vaginal cuff.  All pelvic pedicles were examined and hemostasis was noted.  All instruments removed from the ports. All ports were removed under direct Visualization. The pneumoperitoneum was released. The skin incisions were closed with 4-0 Vicryl and then covered with Derma bond.     Sponge lap and needle counts were correct x. The patient was awakened from anesthesia and taken to the recovery room in stable condition.

## 2022-09-06 NOTE — Discharge Summary (Signed)
Physician Discharge Summary  Patient ID: Kristin Myers MRN: [id number] DOB/AGE: 02-26-1956 67 y.o.  Admit date: 09/06/2022 Discharge date: 09/06/2022  Admission Diagnoses: Right ovarian cyst/ Fibroids/ Pelvic Pain   Discharge Diagnoses:  Principal Problem:   Fibroids Active Problems:   S/P hysterectomy with oophorectomy   Discharged Condition: stable  Hospital Course: pt was admitted for observation after undergoing a robotic assisted laparoscopic hysterectomy with bilateral salpingo-oophorectomy. She did well postoperatively with return of bladder function and tolerating po on pod #0. Pain was well controlled after surgery. She is discharged home on pod #0 in stable condition.   Consults: None  Significant Diagnostic Studies: labs: hgb 12.3 prior to surgery   Treatments: surgery: robotic assisted laparoscopic hysterectomy with bilateral salpingo-oophorectomy   Discharge Exam: Blood pressure (!) 141/65, pulse 96, temperature 97.6 F (36.4 C), resp. rate 14, height '4\' 11"'$  (1.499 m), weight 83 kg, SpO2 94 %. General appearance: alert, cooperative, and no distress Resp: no distress  GI: soft appropriately tender nondistended  Extremities: extremities normal, atraumatic, no cyanosis or edema Incision/Wound: well approximated no erythema or exudate   Disposition: Discharge disposition: 01-Home or Self Care       Discharge Instructions     Call MD for:  persistant nausea and vomiting   Complete by: As directed    Call MD for:  redness, tenderness, or signs of infection (pain, swelling, redness, odor or green/yellow discharge around incision site)   Complete by: As directed    Call MD for:  severe uncontrolled pain   Complete by: As directed    Call MD for:  temperature >100.4   Complete by: As directed    Diet - low sodium heart healthy   Complete by: As directed    Driving Restrictions   Complete by: As directed    Avoid driving for 1 week   Increase activity  slowly   Complete by: As directed    Lifting restrictions   Complete by: As directed    Avoid lifting over 10 lbs   May shower / Bathe   Complete by: As directed    May walk up steps   Complete by: As directed    No wound care   Complete by: As directed    Sexual Activity Restrictions   Complete by: As directed    Avoid sex for 6 weeks      Allergies as of 09/06/2022       Reactions   Bee Venom Anaphylaxis   Pneumococcal Vaccines Shortness Of Breath   Cinnamon Other (See Comments)   Numbness and tingling in mouth.   Latex Rash   Rash in entire mouth   Tramadol Nausea Only   Severe nausea   Sulfa Antibiotics Other (See Comments)   Father had serious reaction to sulfa drug. Patient has never taken Sulfa drugs.        Medication List     TAKE these medications    acetaminophen 500 MG tablet Commonly known as: TYLENOL Take 2 tablets (1,000 mg total) by mouth every 8 (eight) hours as needed.   amLODipine 5 MG tablet Commonly known as: NORVASC Take 5 mg by mouth daily.   CALCIUM 500 PO Take 500 mg by mouth. Calcium Citrate chewable   cholecalciferol 25 MCG (1000 UNIT) tablet Commonly known as: VITAMIN D3 Take 1,000 Units by mouth daily.   ibuprofen 600 MG tablet Commonly known as: ADVIL Take 1 tablet (600 mg total) by mouth every 6 (six) hours as  needed.   Melatonin 5 MG Caps Take 5 mg by mouth at bedtime as needed.   omeprazole 10 MG capsule Commonly known as: PRILOSEC Take 10 mg by mouth 2 (two) times a week. As needed.   oxyCODONE 5 MG immediate release tablet Commonly known as: Oxy IR/ROXICODONE Take 1-2 tablets (5-10 mg total) by mouth every 6 (six) hours as needed for moderate pain or severe pain.   polyethylene glycol powder 17 GM/SCOOP powder Commonly known as: GLYCOLAX/MIRALAX Take 1 Container by mouth daily as needed (one capful as needed).        Follow-up Information     Christophe Louis, MD. Go in 2 week(s).   Specialty: Obstetrics and  Gynecology Contact information: [address] [place] [address] [phone]                 Signed: Christophe Louis 09/06/2022, 4:20 PM

## 2022-09-06 NOTE — Discharge Instructions (Signed)
Post Anesthesia Home Care Instructions  Activity: Get plenty of rest for the remainder of the day. A responsible individual must stay with you for 24 hours following the procedure.  For the next 24 hours, DO NOT: -Drive a car -Paediatric nurse -Drink alcoholic beverages -Take any medication unless instructed by your physician -Make any legal decisions or sign important papers.  Meals: Start with liquid foods such as gelatin or soup. Progress to regular foods as tolerated. Avoid greasy, spicy, heavy foods. If nausea and/or vomiting occur, drink only clear liquids until the nausea and/or vomiting subsides. Call your physician if vomiting continues.  Special Instructions/Symptoms: Your throat may feel dry or sore from the anesthesia or the breathing tube placed in your throat during surgery. If this causes discomfort, gargle with warm salt water. The discomfort should disappear within 24 hours.  If you had a scopolamine patch placed behind your ear for the management of post- operative nausea and/or vomiting:  1. The medication in the patch is effective for 72 hours, after which it should be removed.  Wrap patch in a tissue and discard in the trash. Wash hands thoroughly with soap and water. 2. You may remove the patch earlier than 72 hours if you experience unpleasant side effects which may include dry mouth, dizziness or visual disturbances. 3. Avoid touching the patch. Wash your hands with soap and water after contact with the patch.    Post Anesthesia Home Care Instructions  Activity: Get plenty of rest for the remainder of the day. A responsible individual must stay with you for 24 hours following the procedure.  For the next 24 hours, DO NOT: -Drive a car -Paediatric nurse -Drink alcoholic beverages -Take any medication unless instructed by your physician -Make any legal decisions or sign important papers.  Meals: Start with liquid foods such as gelatin or soup. Progress to  regular foods as tolerated. Avoid greasy, spicy, heavy foods. If nausea and/or vomiting occur, drink only clear liquids until the nausea and/or vomiting subsides. Call your physician if vomiting continues.  Special Instructions/Symptoms: Your throat may feel dry or sore from the anesthesia or the breathing tube placed in your throat during surgery. If this causes discomfort, gargle with warm salt water. The discomfort should disappear within 24 hours.  If you had a scopolamine patch placed behind your ear for the management of post- operative nausea and/or vomiting:  1. The medication in the patch is effective for 72 hours, after which it should be removed.  Wrap patch in a tissue and discard in the trash. Wash hands thoroughly with soap and water. 2. You may remove the patch earlier than 72 hours if you experience unpleasant side effects which may include dry mouth, dizziness or visual disturbances. 3. Avoid touching the patch. Wash your hands with soap and water after contact with the patch.    Post Anesthesia Home Care Instructions  Activity: Get plenty of rest for the remainder of the day. A responsible individual must stay with you for 24 hours following the procedure.  For the next 24 hours, DO NOT: -Drive a car -Paediatric nurse -Drink alcoholic beverages -Take any medication unless instructed by your physician -Make any legal decisions or sign important papers.  Meals: Start with liquid foods such as gelatin or soup. Progress to regular foods as tolerated. Avoid greasy, spicy, heavy foods. If nausea and/or vomiting occur, drink only clear liquids until the nausea and/or vomiting subsides. Call your physician if vomiting continues.  Special Instructions/Symptoms: Your  throat may feel dry or sore from the anesthesia or the breathing tube placed in your throat during surgery. If this causes discomfort, gargle with warm salt water. The discomfort should disappear within 24 hours.  If  you had a scopolamine patch placed behind your ear for the management of post- operative nausea and/or vomiting:  1. The medication in the patch is effective for 72 hours, after which it should be removed.  Wrap patch in a tissue and discard in the trash. Wash hands thoroughly with soap and water. 2. You may remove the patch earlier than 72 hours if you experience unpleasant side effects which may include dry mouth, dizziness or visual disturbances. 3. Avoid touching the patch. Wash your hands with soap and water after contact with the patch.

## 2022-09-06 NOTE — Anesthesia Postprocedure Evaluation (Signed)
Anesthesia Post Note  Patient: Kristin Myers  Procedure(s) Performed: XI ROBOTIC ASSISTED LAPAROSCOPIC HYSTERECTOMY AND BILATERAL SALPINGO-OOPHORECTOMY (Bilateral: Pelvis)     Patient location during evaluation: PACU Anesthesia Type: General Level of consciousness: awake and alert Pain management: pain level controlled Vital Signs Assessment: post-procedure vital signs reviewed and stable Respiratory status: spontaneous breathing, nonlabored ventilation, respiratory function stable and patient connected to nasal cannula oxygen Cardiovascular status: blood pressure returned to baseline and stable Postop Assessment: no apparent nausea or vomiting Anesthetic complications: no  No notable events documented.  Last Vitals:  Vitals:   09/06/22 1227 09/06/22 1256  BP: (!) 140/69 (!) 125/58  Pulse: 87 88  Resp: 16 15  Temp: 36.7 C (!) 36 C  SpO2: 97% 98%    Last Pain:  Vitals:   09/06/22 1227  TempSrc:   PainSc: 5                  Tiajuana Amass

## 2022-09-06 NOTE — Anesthesia Procedure Notes (Signed)
Procedure Name: Intubation Date/Time: 09/06/2022 8:34 AM  Performed by: Clearnce Sorrel, CRNAPre-anesthesia Checklist: Patient identified, Emergency Drugs available, Suction available and Patient being monitored Patient Re-evaluated:Patient Re-evaluated prior to induction Oxygen Delivery Method: Circle System Utilized Preoxygenation: Pre-oxygenation with 100% oxygen Induction Type: IV induction Ventilation: Mask ventilation without difficulty Laryngoscope Size: Mac and 3 Grade View: Grade I Tube type: Oral Tube size: 7.0 mm Number of attempts: 1 Airway Equipment and Method: Stylet and Oral airway Placement Confirmation: ETT inserted through vocal cords under direct vision, positive ETCO2 and breath sounds checked- equal and bilateral Secured at: 21 cm Tube secured with: Tape Dental Injury: Teeth and Oropharynx as per pre-operative assessment

## 2022-09-07 ENCOUNTER — Encounter (HOSPITAL_BASED_OUTPATIENT_CLINIC_OR_DEPARTMENT_OTHER): Payer: Self-pay | Admitting: Obstetrics and Gynecology

## 2022-09-13 LAB — SURGICAL PATHOLOGY

## 2022-09-26 DIAGNOSIS — H43813 Vitreous degeneration, bilateral: Secondary | ICD-10-CM | POA: Diagnosis not present

## 2022-09-26 DIAGNOSIS — H33193 Other retinoschisis and retinal cysts, bilateral: Secondary | ICD-10-CM | POA: Diagnosis not present

## 2022-09-26 DIAGNOSIS — H353231 Exudative age-related macular degeneration, bilateral, with active choroidal neovascularization: Secondary | ICD-10-CM | POA: Diagnosis not present

## 2022-09-26 DIAGNOSIS — H35033 Hypertensive retinopathy, bilateral: Secondary | ICD-10-CM | POA: Diagnosis not present

## 2022-09-26 DIAGNOSIS — H59813 Chorioretinal scars after surgery for detachment, bilateral: Secondary | ICD-10-CM | POA: Diagnosis not present

## 2022-10-18 ENCOUNTER — Ambulatory Visit
Admission: RE | Admit: 2022-10-18 | Discharge: 2022-10-18 | Disposition: A | Discharge status: Home / Self Care | Payer: Medicare HMO | Source: Ambulatory Visit | Attending: Family Medicine | Admitting: Family Medicine

## 2022-10-18 DIAGNOSIS — Z1231 Encounter for screening mammogram for malignant neoplasm of breast: Secondary | ICD-10-CM | POA: Diagnosis not present

## 2022-10-30 DIAGNOSIS — Z0000 Encounter for general adult medical examination without abnormal findings: Secondary | ICD-10-CM | POA: Diagnosis not present

## 2022-10-31 DIAGNOSIS — E559 Vitamin D deficiency, unspecified: Secondary | ICD-10-CM | POA: Diagnosis not present

## 2022-10-31 DIAGNOSIS — E7800 Pure hypercholesterolemia, unspecified: Secondary | ICD-10-CM | POA: Diagnosis not present

## 2022-10-31 DIAGNOSIS — I10 Essential (primary) hypertension: Secondary | ICD-10-CM | POA: Diagnosis not present

## 2022-10-31 DIAGNOSIS — N898 Other specified noninflammatory disorders of vagina: Secondary | ICD-10-CM | POA: Diagnosis not present

## 2022-11-03 DIAGNOSIS — E559 Vitamin D deficiency, unspecified: Secondary | ICD-10-CM | POA: Diagnosis not present

## 2022-11-03 DIAGNOSIS — K219 Gastro-esophageal reflux disease without esophagitis: Secondary | ICD-10-CM | POA: Diagnosis not present

## 2022-11-03 DIAGNOSIS — E7800 Pure hypercholesterolemia, unspecified: Secondary | ICD-10-CM | POA: Diagnosis not present

## 2022-11-03 DIAGNOSIS — Z6836 Body mass index (BMI) 36.0-36.9, adult: Secondary | ICD-10-CM | POA: Diagnosis not present

## 2022-11-03 DIAGNOSIS — I10 Essential (primary) hypertension: Secondary | ICD-10-CM | POA: Diagnosis not present

## 2022-11-03 DIAGNOSIS — Z0000 Encounter for general adult medical examination without abnormal findings: Secondary | ICD-10-CM | POA: Diagnosis not present

## 2022-11-07 DIAGNOSIS — K5900 Constipation, unspecified: Secondary | ICD-10-CM | POA: Diagnosis not present

## 2022-11-07 DIAGNOSIS — Z825 Family history of asthma and other chronic lower respiratory diseases: Secondary | ICD-10-CM | POA: Diagnosis not present

## 2022-11-07 DIAGNOSIS — Z811 Family history of alcohol abuse and dependence: Secondary | ICD-10-CM | POA: Diagnosis not present

## 2022-11-07 DIAGNOSIS — K219 Gastro-esophageal reflux disease without esophagitis: Secondary | ICD-10-CM | POA: Diagnosis not present

## 2022-11-07 DIAGNOSIS — H269 Unspecified cataract: Secondary | ICD-10-CM | POA: Diagnosis not present

## 2022-11-07 DIAGNOSIS — R69 Illness, unspecified: Secondary | ICD-10-CM | POA: Diagnosis not present

## 2022-11-07 DIAGNOSIS — G4700 Insomnia, unspecified: Secondary | ICD-10-CM | POA: Diagnosis not present

## 2022-11-07 DIAGNOSIS — Z008 Encounter for other general examination: Secondary | ICD-10-CM | POA: Diagnosis not present

## 2022-11-07 DIAGNOSIS — Z803 Family history of malignant neoplasm of breast: Secondary | ICD-10-CM | POA: Diagnosis not present

## 2022-11-07 DIAGNOSIS — I10 Essential (primary) hypertension: Secondary | ICD-10-CM | POA: Diagnosis not present

## 2022-11-07 DIAGNOSIS — Z8249 Family history of ischemic heart disease and other diseases of the circulatory system: Secondary | ICD-10-CM | POA: Diagnosis not present

## 2022-11-09 DIAGNOSIS — R519 Headache, unspecified: Secondary | ICD-10-CM | POA: Diagnosis not present

## 2022-11-09 DIAGNOSIS — W010XXA Fall on same level from slipping, tripping and stumbling without subsequent striking against object, initial encounter: Secondary | ICD-10-CM | POA: Diagnosis not present

## 2022-11-09 DIAGNOSIS — S51012A Laceration without foreign body of left elbow, initial encounter: Secondary | ICD-10-CM | POA: Diagnosis not present

## 2022-11-09 DIAGNOSIS — S80212A Abrasion, left knee, initial encounter: Secondary | ICD-10-CM | POA: Diagnosis not present

## 2022-11-09 DIAGNOSIS — Y92008 Other place in unspecified non-institutional (private) residence as the place of occurrence of the external cause: Secondary | ICD-10-CM | POA: Diagnosis not present

## 2022-11-09 DIAGNOSIS — S199XXA Unspecified injury of neck, initial encounter: Secondary | ICD-10-CM | POA: Diagnosis not present

## 2022-11-09 DIAGNOSIS — S0990XA Unspecified injury of head, initial encounter: Secondary | ICD-10-CM | POA: Diagnosis not present

## 2022-11-09 DIAGNOSIS — R110 Nausea: Secondary | ICD-10-CM | POA: Diagnosis not present

## 2022-11-09 DIAGNOSIS — R42 Dizziness and giddiness: Secondary | ICD-10-CM | POA: Diagnosis not present

## 2022-11-15 DIAGNOSIS — S0093XD Contusion of unspecified part of head, subsequent encounter: Secondary | ICD-10-CM | POA: Diagnosis not present

## 2022-11-15 DIAGNOSIS — Z6836 Body mass index (BMI) 36.0-36.9, adult: Secondary | ICD-10-CM | POA: Diagnosis not present

## 2022-11-15 DIAGNOSIS — R0989 Other specified symptoms and signs involving the circulatory and respiratory systems: Secondary | ICD-10-CM | POA: Diagnosis not present

## 2022-11-15 DIAGNOSIS — R9389 Abnormal findings on diagnostic imaging of other specified body structures: Secondary | ICD-10-CM | POA: Diagnosis not present

## 2022-11-15 DIAGNOSIS — Z09 Encounter for follow-up examination after completed treatment for conditions other than malignant neoplasm: Secondary | ICD-10-CM | POA: Diagnosis not present

## 2022-11-15 DIAGNOSIS — W19XXXA Unspecified fall, initial encounter: Secondary | ICD-10-CM | POA: Diagnosis not present

## 2022-11-21 ENCOUNTER — Encounter (HOSPITAL_BASED_OUTPATIENT_CLINIC_OR_DEPARTMENT_OTHER): Payer: Self-pay | Admitting: Family Medicine

## 2022-11-22 ENCOUNTER — Other Ambulatory Visit: Payer: Self-pay | Admitting: *Deleted

## 2022-11-22 DIAGNOSIS — I739 Peripheral vascular disease, unspecified: Secondary | ICD-10-CM

## 2022-11-27 ENCOUNTER — Ambulatory Visit (HOSPITAL_COMMUNITY)
Admission: RE | Admit: 2022-11-27 | Discharge: 2022-11-27 | Disposition: A | Discharge status: Home / Self Care | Payer: Medicare HMO | Source: Ambulatory Visit | Attending: Surgery | Admitting: Surgery

## 2022-11-27 DIAGNOSIS — I739 Peripheral vascular disease, unspecified: Secondary | ICD-10-CM | POA: Diagnosis not present

## 2022-11-27 LAB — VAS US ABI WITH/WO TBI
Left ABI: 1.14
Right ABI: 1.23

## 2022-11-28 DIAGNOSIS — H353231 Exudative age-related macular degeneration, bilateral, with active choroidal neovascularization: Secondary | ICD-10-CM | POA: Diagnosis not present

## 2022-12-01 DIAGNOSIS — H52223 Regular astigmatism, bilateral: Secondary | ICD-10-CM | POA: Diagnosis not present

## 2022-12-04 NOTE — Progress Notes (Signed)
[facility] [place]  ASSESSMENT / PLAN: 67 y.o. female with recent syncopal episodes. Concern for peripheral arterial disease and carotid stenosis based on recent exam. No evidence of hemodynamically significant peripheral arterial disease on non-invasive testing today. No evidence of carotid artery stenosis on duplex today. Patient can follow up with me on an as-needed basis.    CHIEF COMPLAINT: Concern for atherosclerosis during syncope workup  HISTORY OF PRESENT ILLNESS: Kristin Myers is a 67 y.o. female referred to clinic for evaluation of possible peripheral arterial disease and carotid artery stenosis.  The patient reports recent episodes of syncope.  She has been walking 1 suddenly she lost consciousness and struck her head.  This has happened 3 times.  Most recently she suffered a concussion.  Workup initiated with concerning for possible carotid artery stenosis in the ER.  When she followed up with her primary care physician, he had difficulty finding pedal pulses.  Patient was referred for further evaluation.  The patient has no lower extremity symptoms referable to peripheral arterial disease.  She specifically denies any claudication, rest pain, ischemic ulceration.  She has never suffered a stroke or TIA.   Past Medical History:  Diagnosis Date   Anemia    many years ago as of 2024   Anxiety    hx of, no longer on meds   COVID 2022   Treated w/ Paxlovid   Depression    hx of, no longer meds   Family history of cardiovascular disease    Father died of heart attack at 9 years old.   GERD (gastroesophageal reflux disease)    History of esophageal stricture 11/25/2015   esophageal dilation during EGD   History of kidney stones    67 years old   Hypertension    Follows w/ PCP, Dr. Vianne Bulls w/ [facility].   Ileus (HCC)    intestinal blockage around 20 years ago as of 08/29/2022 per pt   Macular degeneration    Dr. 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   Wears glasses    Wears partial dentures     Past Surgical History:  Procedure Laterality Date   CATARACT EXTRACTION W/ INTRAOCULAR LENS  IMPLANT, BILATERAL Bilateral    around 10 years ago as of 2024 per pt   COLONOSCOPY     x 2   ESOPHAGOGASTRODUODENOSCOPY (EGD) WITH PROPOFOL N/A 11/25/2015   Procedure: ESOPHAGOGASTRODUODENOSCOPY (EGD) WITH PROPOFOL;  Surgeon: Carman Ching, MD;  Location: [facility] ENDOSCOPY;  Service: Endoscopy;  Laterality: N/A;   ROBOTIC ASSISTED LAPAROSCOPIC HYSTERECTOMY AND SALPINGECTOMY Bilateral 09/06/2022   Procedure: XI ROBOTIC ASSISTED LAPAROSCOPIC HYSTERECTOMY AND BILATERAL SALPINGO-OOPHORECTOMY;  Surgeon: Gerald Leitz, MD;  Location: [facility];  Service: Gynecology;  Laterality: Bilateral;   SAVORY DILATION N/A 11/25/2015   Procedure: SAVORY DILATION;  Surgeon: Carman Ching, MD;  Location: [facility] ENDOSCOPY;  Service: Endoscopy;  Laterality: N/A;    Family History  Problem Relation Age of Onset   Heart attack Father    Breast cancer Sister     Social History   Socioeconomic History   Marital status: Single    Spouse name: Not on file   Number of children: 2   Years of education: MA   Highest education  level: Not on file  Occupational History   Occupation: RECEIVER    Employer: [id number]  Tobacco Use   Smoking status: Never   Smokeless tobacco: Never  Vaping Use   Vaping Use: Never used  Substance and Sexual Activity   Alcohol use: Yes    Comment: rare; once or twice annually   Drug use: No   Sexual activity: Not on file  Other Topics Concern   Not on file  Social History Narrative   Patient lives alone.    Caffeine Use: 1 soda daily.   Social Determinants of Health   Financial Resource Strain: Not on file  Food Insecurity:  Not on file  Transportation Needs: Not on file  Physical Activity: Not on file  Stress: Not on file  Social Connections: Not on file  Intimate Partner Violence: Not on file    Allergies  Allergen Reactions   Bee Venom Anaphylaxis   Pneumococcal Vaccines Shortness Of Breath   Cinnamon Other (See Comments)    Numbness and tingling in mouth.   Latex Rash    Rash in entire mouth   Tramadol Nausea Only    Severe nausea   Sulfa Antibiotics Other (See Comments)    Father had serious reaction to sulfa drug. Patient has never taken Sulfa drugs.    Current Outpatient Medications  Medication Sig Dispense Refill   acetaminophen (TYLENOL) 500 MG tablet Take 2 tablets (1,000 mg total) by mouth every 8 (eight) hours as needed. 30 tablet 0   amLODipine (NORVASC) 5 MG tablet Take 5 mg by mouth daily.     Calcium Carbonate (CALCIUM 500 PO) Take 500 mg by mouth. Calcium Citrate chewable     cholecalciferol (VITAMIN D3) 25 MCG (1000 UNIT) tablet Take 1,000 Units by mouth daily.     ibuprofen (ADVIL) 600 MG tablet Take 1 tablet (600 mg total) by mouth every 6 (six) hours as needed. 30 tablet 1   Melatonin 5 MG CAPS Take 5 mg by mouth at bedtime as needed.     omeprazole (PRILOSEC) 10 MG capsule Take 10 mg by mouth 2 (two) times a week. As needed.     oxyCODONE (OXY IR/ROXICODONE) 5 MG immediate release tablet Take 1-2 tablets (5-10 mg total) by mouth every 6 (six) hours as needed for moderate pain or severe pain. 20 tablet 0   polyethylene glycol powder (GLYCOLAX/MIRALAX) powder Take 1 Container by mouth daily as needed (one capful as needed).     No current facility-administered medications for this visit.    PHYSICAL EXAM Vitals:   12/05/22 0840  BP: (!) 164/75  Pulse: 65  Resp: 20  Temp: 98.1 F (36.7 C)  SpO2: 100%  Weight: 181 lb (82.1 kg)  Height: 4\' 11"  (1.499 m)    Well-appearing woman in no acute distress Regular rate and rhythm Unlabored breathing 2+ dorsalis pedis pulses  bilaterally.  No easily palpable posterior tibial pulses.   PERTINENT LABORATORY AND RADIOLOGIC DATA  Most recent CBC    Latest Ref Rng & Units 08/31/2022   10:39 AM 06/05/2011    3:08 PM  CBC  WBC 4.0 - 10.5 K/uL 5.5  5.4   Hemoglobin 12.0 - 15.0 g/dL 45.4  09.8   Hematocrit 36.0 - 46.0 % 39.4  38.0   Platelets 150 - 400 K/uL 306  304      Most recent CMP    Latest Ref Rng & Units 08/31/2022   10:39 AM  CMP  Glucose 70 -  99 mg/dL 102   BUN 8 - 23 mg/dL 16   Creatinine 7.25 - 1.00 mg/dL 3.66   Sodium 440 - 347 mmol/L 138   Potassium 3.5 - 5.1 mmol/L 4.1   Chloride 98 - 111 mmol/L 106   CO2 22 - 32 mmol/L 22   Calcium 8.9 - 10.3 mg/dL 9.2     +-------+-----------+-----------+------------+------------+  ABI/TBIToday's ABIToday's TBIPrevious ABIPrevious TBI  +-------+-----------+-----------+------------+------------+  Right 1.23       0.84                                 +-------+-----------+-----------+------------+------------+  Left  1.14       0.81                                 +-------+-----------+-----------+------------+------------+   Right Carotid: There is no evidence of stenosis in the right ICA.   Left Carotid: There is no evidence of stenosis in the left ICA.   Vertebrals:  Bilateral vertebral arteries demonstrate antegrade flow.  Subclavians: Normal flow hemodynamics were seen in bilateral subclavian               arteries.  Rande Brunt. Lenell Antu, MD FACS [facility] Phone Number: [phone] 12/04/2022 10:29 AM   Total time spent on preparing this encounter including chart review, data review, collecting history, examining the patient, coordinating care for this new patient, 60 minutes.  Portions of this report may have been transcribed using voice recognition software.  Every effort has been made to ensure accuracy; however, inadvertent computerized transcription errors may still be present.

## 2022-12-05 ENCOUNTER — Encounter: Payer: Self-pay | Admitting: Vascular Surgery

## 2022-12-05 ENCOUNTER — Ambulatory Visit: Payer: Medicare HMO | Admitting: Vascular Surgery

## 2022-12-05 ENCOUNTER — Other Ambulatory Visit (HOSPITAL_COMMUNITY): Payer: Self-pay | Admitting: Vascular Surgery

## 2022-12-05 ENCOUNTER — Ambulatory Visit (HOSPITAL_COMMUNITY)
Admission: RE | Admit: 2022-12-05 | Discharge: 2022-12-05 | Disposition: A | Discharge status: Home / Self Care | Payer: Medicare HMO | Source: Ambulatory Visit | Attending: Vascular Surgery | Admitting: Vascular Surgery

## 2022-12-05 VITALS — BP 164/75 | HR 65 | Temp 98.1°F | Resp 20 | Ht 59.0 in | Wt 181.0 lb

## 2022-12-05 DIAGNOSIS — I779 Disorder of arteries and arterioles, unspecified: Secondary | ICD-10-CM | POA: Insufficient documentation

## 2022-12-05 DIAGNOSIS — R55 Syncope and collapse: Secondary | ICD-10-CM | POA: Diagnosis not present

## 2023-01-13 DIAGNOSIS — H524 Presbyopia: Secondary | ICD-10-CM | POA: Diagnosis not present

## 2023-01-16 DIAGNOSIS — H353231 Exudative age-related macular degeneration, bilateral, with active choroidal neovascularization: Secondary | ICD-10-CM | POA: Diagnosis not present

## 2023-02-07 DIAGNOSIS — R030 Elevated blood-pressure reading, without diagnosis of hypertension: Secondary | ICD-10-CM | POA: Diagnosis not present

## 2023-02-07 DIAGNOSIS — D485 Neoplasm of uncertain behavior of skin: Secondary | ICD-10-CM | POA: Diagnosis not present

## 2023-02-14 DIAGNOSIS — D485 Neoplasm of uncertain behavior of skin: Secondary | ICD-10-CM | POA: Diagnosis not present

## 2023-02-14 DIAGNOSIS — L720 Epidermal cyst: Secondary | ICD-10-CM | POA: Diagnosis not present

## 2023-02-21 DIAGNOSIS — K5730 Diverticulosis of large intestine without perforation or abscess without bleeding: Secondary | ICD-10-CM | POA: Diagnosis not present

## 2023-02-21 DIAGNOSIS — K6389 Other specified diseases of intestine: Secondary | ICD-10-CM | POA: Diagnosis not present

## 2023-02-21 DIAGNOSIS — D123 Benign neoplasm of transverse colon: Secondary | ICD-10-CM | POA: Diagnosis not present

## 2023-02-21 DIAGNOSIS — K648 Other hemorrhoids: Secondary | ICD-10-CM | POA: Diagnosis not present

## 2023-02-21 DIAGNOSIS — D122 Benign neoplasm of ascending colon: Secondary | ICD-10-CM | POA: Diagnosis not present

## 2023-02-21 DIAGNOSIS — Z1211 Encounter for screening for malignant neoplasm of colon: Secondary | ICD-10-CM | POA: Diagnosis not present

## 2023-02-27 DIAGNOSIS — H353231 Exudative age-related macular degeneration, bilateral, with active choroidal neovascularization: Secondary | ICD-10-CM | POA: Diagnosis not present

## 2023-02-27 DIAGNOSIS — H43813 Vitreous degeneration, bilateral: Secondary | ICD-10-CM | POA: Diagnosis not present

## 2023-02-27 DIAGNOSIS — H35033 Hypertensive retinopathy, bilateral: Secondary | ICD-10-CM | POA: Diagnosis not present

## 2023-02-27 DIAGNOSIS — H33193 Other retinoschisis and retinal cysts, bilateral: Secondary | ICD-10-CM | POA: Diagnosis not present

## 2023-02-27 DIAGNOSIS — Z961 Presence of intraocular lens: Secondary | ICD-10-CM | POA: Diagnosis not present

## 2023-02-28 DIAGNOSIS — G479 Sleep disorder, unspecified: Secondary | ICD-10-CM | POA: Diagnosis not present

## 2023-02-28 DIAGNOSIS — Z09 Encounter for follow-up examination after completed treatment for conditions other than malignant neoplasm: Secondary | ICD-10-CM | POA: Diagnosis not present

## 2023-02-28 DIAGNOSIS — D123 Benign neoplasm of transverse colon: Secondary | ICD-10-CM | POA: Diagnosis not present

## 2023-02-28 DIAGNOSIS — K5900 Constipation, unspecified: Secondary | ICD-10-CM | POA: Diagnosis not present

## 2023-03-05 DIAGNOSIS — R509 Fever, unspecified: Secondary | ICD-10-CM | POA: Diagnosis not present

## 2023-03-05 DIAGNOSIS — R051 Acute cough: Secondary | ICD-10-CM | POA: Diagnosis not present

## 2023-03-05 DIAGNOSIS — Z03818 Encounter for observation for suspected exposure to other biological agents ruled out: Secondary | ICD-10-CM | POA: Diagnosis not present

## 2023-03-05 DIAGNOSIS — Z6835 Body mass index (BMI) 35.0-35.9, adult: Secondary | ICD-10-CM | POA: Diagnosis not present

## 2023-03-13 ENCOUNTER — Encounter (HOSPITAL_BASED_OUTPATIENT_CLINIC_OR_DEPARTMENT_OTHER): Payer: Self-pay | Admitting: Family Medicine

## 2023-04-10 DIAGNOSIS — H353231 Exudative age-related macular degeneration, bilateral, with active choroidal neovascularization: Secondary | ICD-10-CM | POA: Diagnosis not present

## 2023-04-25 DIAGNOSIS — F902 Attention-deficit hyperactivity disorder, combined type: Secondary | ICD-10-CM | POA: Diagnosis not present

## 2023-05-02 DIAGNOSIS — F902 Attention-deficit hyperactivity disorder, combined type: Secondary | ICD-10-CM | POA: Diagnosis not present

## 2023-05-22 DIAGNOSIS — H353231 Exudative age-related macular degeneration, bilateral, with active choroidal neovascularization: Secondary | ICD-10-CM | POA: Diagnosis not present

## 2023-06-27 DIAGNOSIS — F900 Attention-deficit hyperactivity disorder, predominantly inattentive type: Secondary | ICD-10-CM | POA: Diagnosis not present

## 2023-07-03 DIAGNOSIS — H353231 Exudative age-related macular degeneration, bilateral, with active choroidal neovascularization: Secondary | ICD-10-CM | POA: Diagnosis not present

## 2023-07-04 DIAGNOSIS — F4311 Post-traumatic stress disorder, acute: Secondary | ICD-10-CM | POA: Diagnosis not present

## 2023-07-04 DIAGNOSIS — F900 Attention-deficit hyperactivity disorder, predominantly inattentive type: Secondary | ICD-10-CM | POA: Diagnosis not present

## 2023-07-18 DIAGNOSIS — F4311 Post-traumatic stress disorder, acute: Secondary | ICD-10-CM | POA: Diagnosis not present

## 2023-07-18 DIAGNOSIS — F900 Attention-deficit hyperactivity disorder, predominantly inattentive type: Secondary | ICD-10-CM | POA: Diagnosis not present

## 2023-08-01 DIAGNOSIS — F900 Attention-deficit hyperactivity disorder, predominantly inattentive type: Secondary | ICD-10-CM | POA: Diagnosis not present

## 2023-08-01 DIAGNOSIS — F4311 Post-traumatic stress disorder, acute: Secondary | ICD-10-CM | POA: Diagnosis not present

## 2023-08-14 DIAGNOSIS — H353231 Exudative age-related macular degeneration, bilateral, with active choroidal neovascularization: Secondary | ICD-10-CM | POA: Diagnosis not present

## 2023-08-14 DIAGNOSIS — H35033 Hypertensive retinopathy, bilateral: Secondary | ICD-10-CM | POA: Diagnosis not present

## 2023-08-14 DIAGNOSIS — Z961 Presence of intraocular lens: Secondary | ICD-10-CM | POA: Diagnosis not present

## 2023-08-14 DIAGNOSIS — H43813 Vitreous degeneration, bilateral: Secondary | ICD-10-CM | POA: Diagnosis not present

## 2023-08-14 DIAGNOSIS — H33193 Other retinoschisis and retinal cysts, bilateral: Secondary | ICD-10-CM | POA: Diagnosis not present

## 2023-08-15 DIAGNOSIS — F900 Attention-deficit hyperactivity disorder, predominantly inattentive type: Secondary | ICD-10-CM | POA: Diagnosis not present

## 2023-08-15 DIAGNOSIS — F4311 Post-traumatic stress disorder, acute: Secondary | ICD-10-CM | POA: Diagnosis not present

## 2023-08-27 DIAGNOSIS — F900 Attention-deficit hyperactivity disorder, predominantly inattentive type: Secondary | ICD-10-CM | POA: Diagnosis not present

## 2023-08-27 DIAGNOSIS — F4311 Post-traumatic stress disorder, acute: Secondary | ICD-10-CM | POA: Diagnosis not present

## 2023-09-12 DIAGNOSIS — F900 Attention-deficit hyperactivity disorder, predominantly inattentive type: Secondary | ICD-10-CM | POA: Diagnosis not present

## 2023-09-12 DIAGNOSIS — F4311 Post-traumatic stress disorder, acute: Secondary | ICD-10-CM | POA: Diagnosis not present

## 2023-09-19 ENCOUNTER — Other Ambulatory Visit: Payer: Self-pay | Admitting: Family Medicine

## 2023-09-19 DIAGNOSIS — Z1231 Encounter for screening mammogram for malignant neoplasm of breast: Secondary | ICD-10-CM

## 2023-09-25 DIAGNOSIS — H353231 Exudative age-related macular degeneration, bilateral, with active choroidal neovascularization: Secondary | ICD-10-CM | POA: Diagnosis not present

## 2023-09-26 DIAGNOSIS — F900 Attention-deficit hyperactivity disorder, predominantly inattentive type: Secondary | ICD-10-CM | POA: Diagnosis not present

## 2023-09-26 DIAGNOSIS — F4311 Post-traumatic stress disorder, acute: Secondary | ICD-10-CM | POA: Diagnosis not present

## 2023-10-23 DIAGNOSIS — H353231 Exudative age-related macular degeneration, bilateral, with active choroidal neovascularization: Secondary | ICD-10-CM | POA: Diagnosis not present

## 2023-10-24 ENCOUNTER — Ambulatory Visit
Admission: RE | Admit: 2023-10-24 | Discharge: 2023-10-24 | Disposition: A | Discharge status: Home / Self Care | Payer: Medicare HMO | Source: Ambulatory Visit | Attending: Family Medicine | Admitting: Family Medicine

## 2023-10-24 DIAGNOSIS — Z1231 Encounter for screening mammogram for malignant neoplasm of breast: Secondary | ICD-10-CM

## 2023-11-06 DIAGNOSIS — Z6835 Body mass index (BMI) 35.0-35.9, adult: Secondary | ICD-10-CM | POA: Diagnosis not present

## 2023-11-06 DIAGNOSIS — Z0000 Encounter for general adult medical examination without abnormal findings: Secondary | ICD-10-CM | POA: Diagnosis not present

## 2023-11-06 DIAGNOSIS — E559 Vitamin D deficiency, unspecified: Secondary | ICD-10-CM | POA: Diagnosis not present

## 2023-11-06 DIAGNOSIS — E7800 Pure hypercholesterolemia, unspecified: Secondary | ICD-10-CM | POA: Diagnosis not present

## 2023-11-06 DIAGNOSIS — I10 Essential (primary) hypertension: Secondary | ICD-10-CM | POA: Diagnosis not present

## 2023-11-07 ENCOUNTER — Other Ambulatory Visit: Payer: Self-pay | Admitting: Family Medicine

## 2023-11-07 DIAGNOSIS — I25119 Atherosclerotic heart disease of native coronary artery with unspecified angina pectoris: Secondary | ICD-10-CM | POA: Diagnosis not present

## 2023-11-07 DIAGNOSIS — Z882 Allergy status to sulfonamides status: Secondary | ICD-10-CM | POA: Diagnosis not present

## 2023-11-07 DIAGNOSIS — G4700 Insomnia, unspecified: Secondary | ICD-10-CM | POA: Diagnosis not present

## 2023-11-07 DIAGNOSIS — Z809 Family history of malignant neoplasm, unspecified: Secondary | ICD-10-CM | POA: Diagnosis not present

## 2023-11-07 DIAGNOSIS — F321 Major depressive disorder, single episode, moderate: Secondary | ICD-10-CM | POA: Diagnosis not present

## 2023-11-07 DIAGNOSIS — K589 Irritable bowel syndrome without diarrhea: Secondary | ICD-10-CM | POA: Diagnosis not present

## 2023-11-07 DIAGNOSIS — Z87892 Personal history of anaphylaxis: Secondary | ICD-10-CM | POA: Diagnosis not present

## 2023-11-07 DIAGNOSIS — Z6835 Body mass index (BMI) 35.0-35.9, adult: Secondary | ICD-10-CM | POA: Diagnosis not present

## 2023-11-07 DIAGNOSIS — H353290 Exudative age-related macular degeneration, unspecified eye, stage unspecified: Secondary | ICD-10-CM | POA: Diagnosis not present

## 2023-11-07 DIAGNOSIS — K219 Gastro-esophageal reflux disease without esophagitis: Secondary | ICD-10-CM | POA: Diagnosis not present

## 2023-11-07 DIAGNOSIS — Z780 Asymptomatic menopausal state: Secondary | ICD-10-CM

## 2023-11-07 DIAGNOSIS — Z8249 Family history of ischemic heart disease and other diseases of the circulatory system: Secondary | ICD-10-CM | POA: Diagnosis not present

## 2023-11-08 DIAGNOSIS — Z0000 Encounter for general adult medical examination without abnormal findings: Secondary | ICD-10-CM | POA: Diagnosis not present

## 2023-11-08 DIAGNOSIS — Z6834 Body mass index (BMI) 34.0-34.9, adult: Secondary | ICD-10-CM | POA: Diagnosis not present

## 2023-11-08 DIAGNOSIS — E7800 Pure hypercholesterolemia, unspecified: Secondary | ICD-10-CM | POA: Diagnosis not present

## 2023-11-08 DIAGNOSIS — E559 Vitamin D deficiency, unspecified: Secondary | ICD-10-CM | POA: Diagnosis not present

## 2023-11-08 DIAGNOSIS — Z8719 Personal history of other diseases of the digestive system: Secondary | ICD-10-CM | POA: Diagnosis not present

## 2023-11-08 DIAGNOSIS — M810 Age-related osteoporosis without current pathological fracture: Secondary | ICD-10-CM | POA: Diagnosis not present

## 2023-11-08 DIAGNOSIS — I10 Essential (primary) hypertension: Secondary | ICD-10-CM | POA: Diagnosis not present

## 2023-11-08 DIAGNOSIS — L989 Disorder of the skin and subcutaneous tissue, unspecified: Secondary | ICD-10-CM | POA: Diagnosis not present

## 2023-11-08 DIAGNOSIS — E669 Obesity, unspecified: Secondary | ICD-10-CM | POA: Diagnosis not present

## 2023-11-09 ENCOUNTER — Other Ambulatory Visit (HOSPITAL_BASED_OUTPATIENT_CLINIC_OR_DEPARTMENT_OTHER): Payer: Self-pay | Admitting: Family Medicine

## 2023-11-09 DIAGNOSIS — E7800 Pure hypercholesterolemia, unspecified: Secondary | ICD-10-CM

## 2023-11-16 DIAGNOSIS — Z6835 Body mass index (BMI) 35.0-35.9, adult: Secondary | ICD-10-CM | POA: Diagnosis not present

## 2023-11-16 DIAGNOSIS — M795 Residual foreign body in soft tissue: Secondary | ICD-10-CM | POA: Diagnosis not present

## 2023-11-20 DIAGNOSIS — N3941 Urge incontinence: Secondary | ICD-10-CM | POA: Diagnosis not present

## 2023-11-20 DIAGNOSIS — Z9189 Other specified personal risk factors, not elsewhere classified: Secondary | ICD-10-CM | POA: Diagnosis not present

## 2023-11-20 DIAGNOSIS — Z8049 Family history of malignant neoplasm of other genital organs: Secondary | ICD-10-CM | POA: Diagnosis not present

## 2023-11-20 DIAGNOSIS — R3915 Urgency of urination: Secondary | ICD-10-CM | POA: Diagnosis not present

## 2023-11-21 DIAGNOSIS — F900 Attention-deficit hyperactivity disorder, predominantly inattentive type: Secondary | ICD-10-CM | POA: Diagnosis not present

## 2023-11-21 DIAGNOSIS — F4311 Post-traumatic stress disorder, acute: Secondary | ICD-10-CM | POA: Diagnosis not present

## 2023-11-26 DIAGNOSIS — F4311 Post-traumatic stress disorder, acute: Secondary | ICD-10-CM | POA: Diagnosis not present

## 2023-11-26 DIAGNOSIS — F900 Attention-deficit hyperactivity disorder, predominantly inattentive type: Secondary | ICD-10-CM | POA: Diagnosis not present

## 2023-11-27 DIAGNOSIS — H353231 Exudative age-related macular degeneration, bilateral, with active choroidal neovascularization: Secondary | ICD-10-CM | POA: Diagnosis not present

## 2023-11-28 ENCOUNTER — Ambulatory Visit (HOSPITAL_BASED_OUTPATIENT_CLINIC_OR_DEPARTMENT_OTHER)
Admission: RE | Admit: 2023-11-28 | Discharge: 2023-11-28 | Disposition: A | Discharge status: Home / Self Care | Source: Ambulatory Visit | Attending: Family Medicine | Admitting: Family Medicine

## 2023-11-28 DIAGNOSIS — E7800 Pure hypercholesterolemia, unspecified: Secondary | ICD-10-CM | POA: Insufficient documentation

## 2023-12-15 IMAGING — MG MM DIGITAL SCREENING BILAT W/ TOMO AND CAD
8 series · 8 of 24 positions shown · non-contrast
Comparison: Previous exam(s).

ACR Breast Density Category a: The breast tissue is almost entirely
fatty.

CLINICAL DATA: Screening.

EXAM:
DIGITAL SCREENING BILATERAL MAMMOGRAM WITH TOMOSYNTHESIS AND CAD
TECHNIQUE: Bilateral screening digital craniocaudal and mediolateral oblique
mammograms were obtained. Bilateral screening digital breast
tomosynthesis was performed. The images were evaluated with
computer-aided detection.

[R MLO synth-2D]
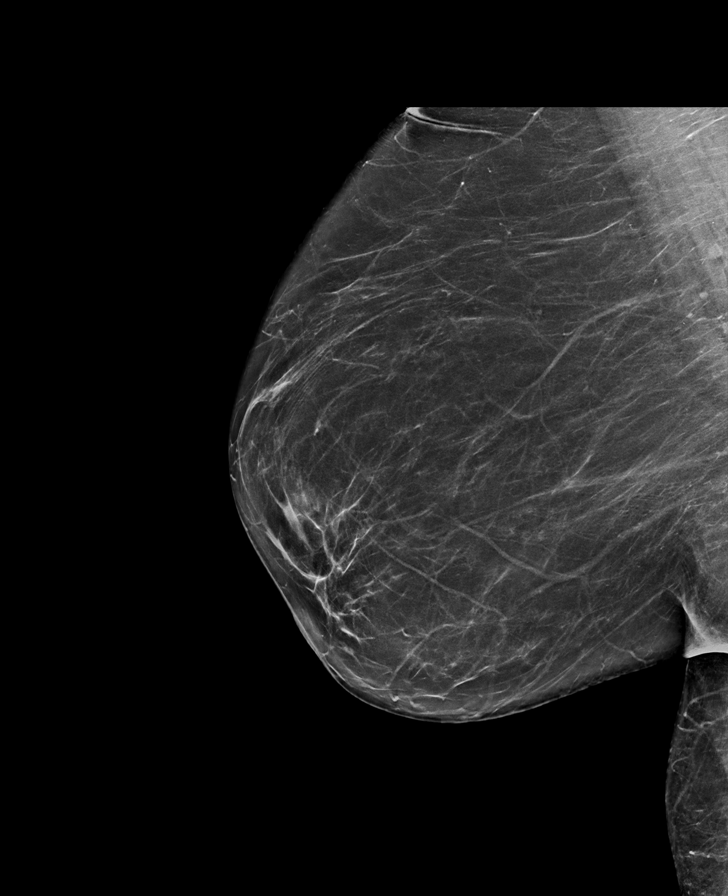

[L CC synth-2D]
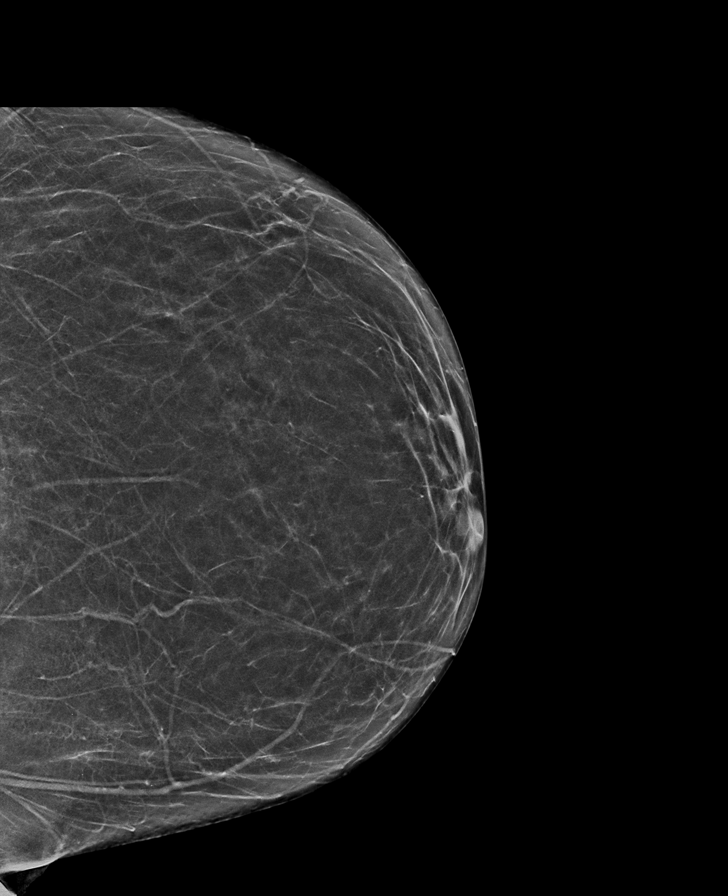

[R CC synth-2D]
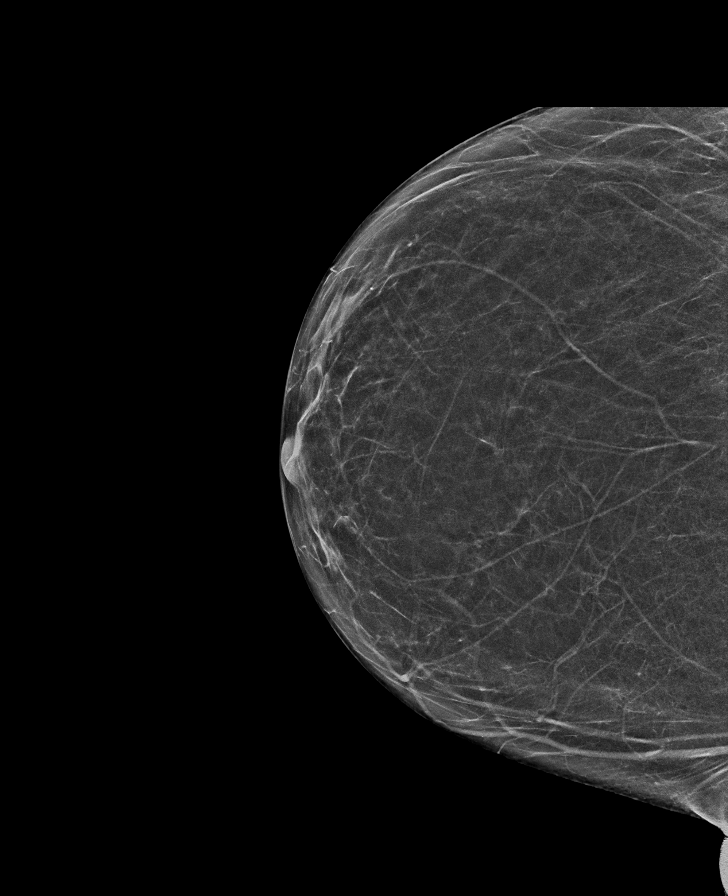

[L MLO synth-2D]
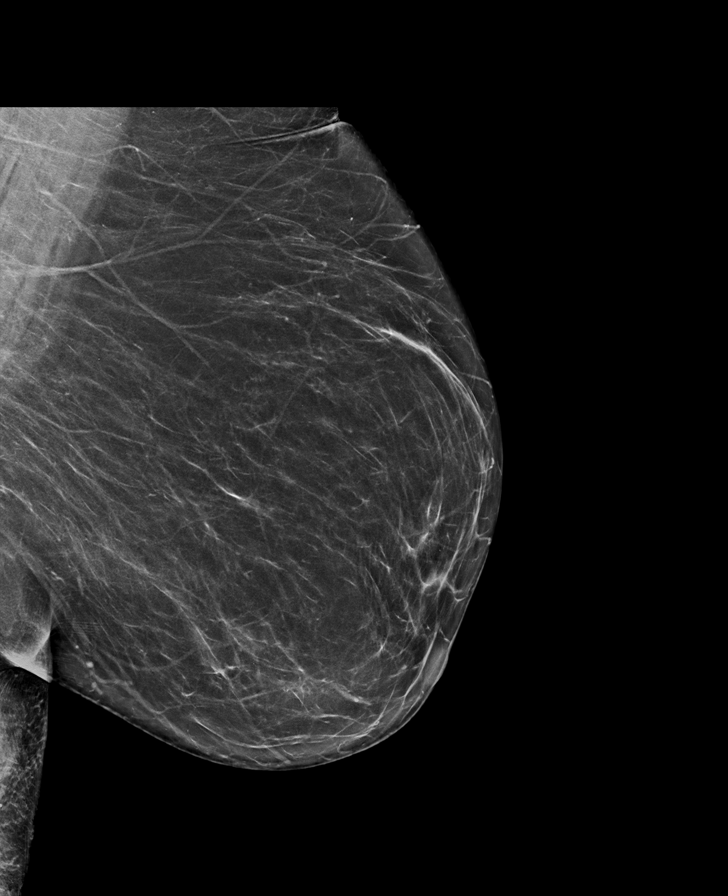

[R MLO tomo · tomo slice 37/74.0]
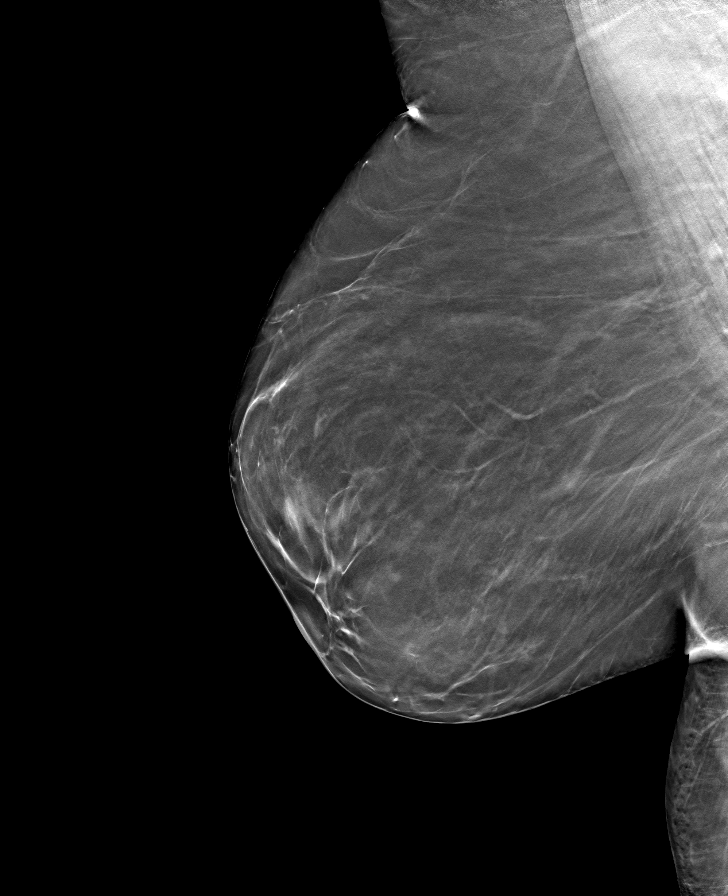

[L MLO tomo · tomo slice 37/72.0]
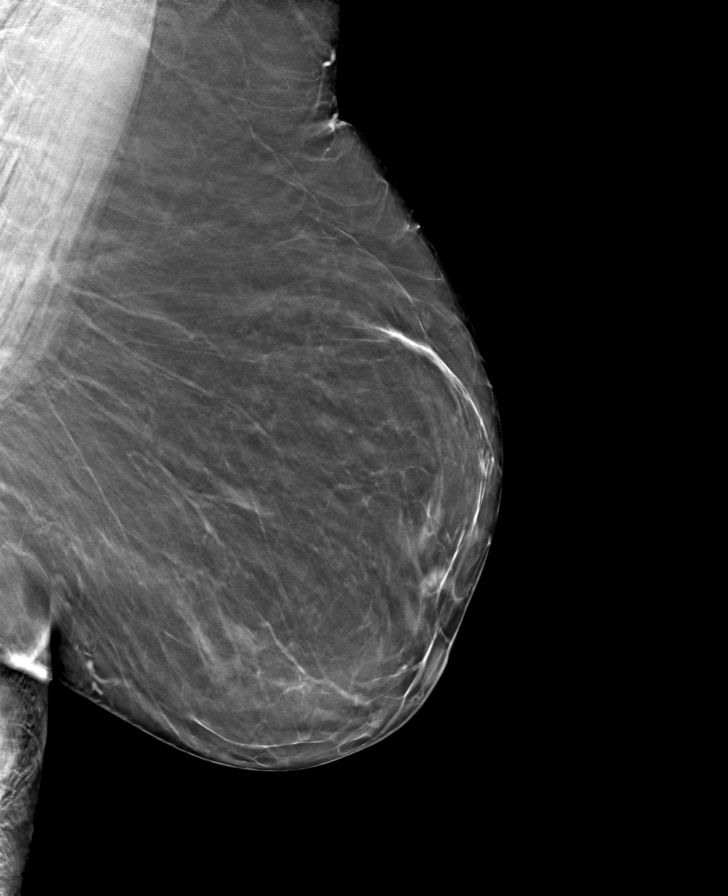

[L CC tomo · tomo slice 33/64.0]
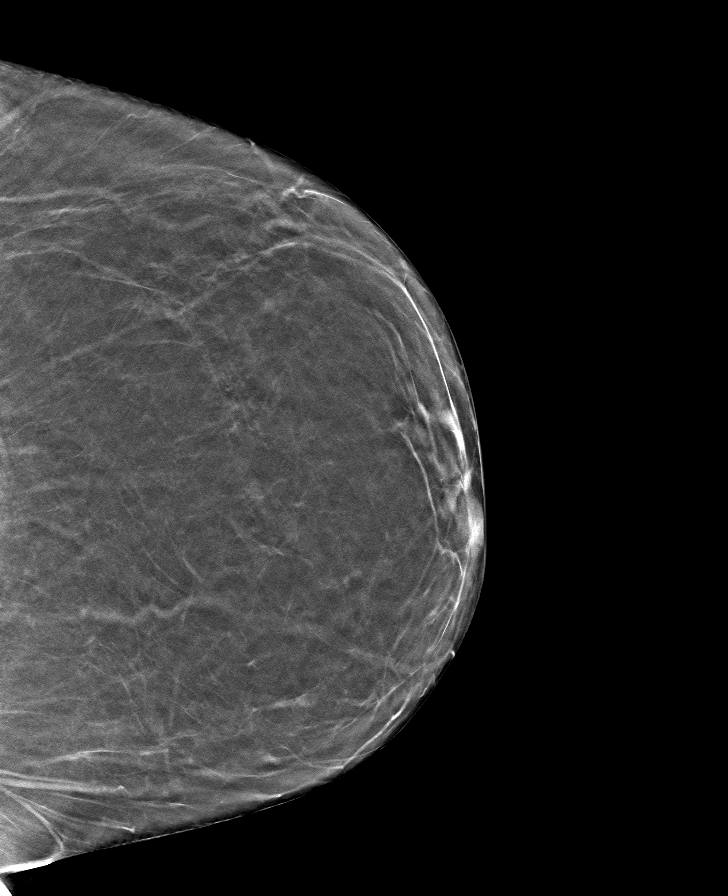

[R CC tomo · tomo slice 31/60.0]
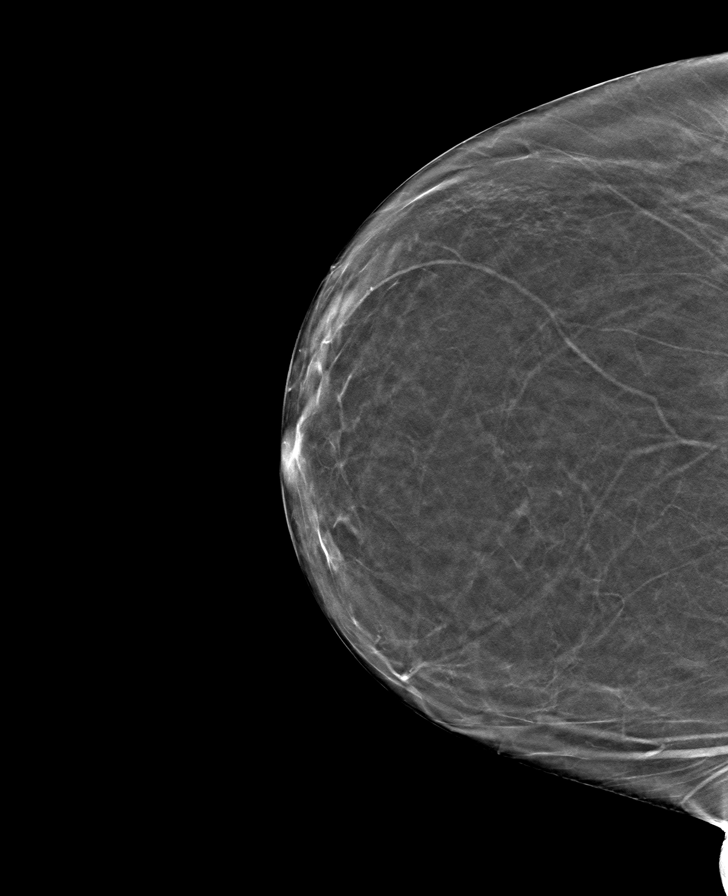

[8 of 24 positions shown; findings below may reference images not displayed]

FINDINGS: There are no findings suspicious for malignancy.
IMPRESSION: No mammographic evidence of malignancy. A result letter of this
screening mammogram will be mailed directly to the patient.

RECOMMENDATION:
Screening mammogram in one year. (Code:0E-3-N98)

BI-RADS CATEGORY  1: Negative.

## 2024-01-01 DIAGNOSIS — H353231 Exudative age-related macular degeneration, bilateral, with active choroidal neovascularization: Secondary | ICD-10-CM | POA: Diagnosis not present

## 2024-01-01 DIAGNOSIS — H35033 Hypertensive retinopathy, bilateral: Secondary | ICD-10-CM | POA: Diagnosis not present

## 2024-01-01 DIAGNOSIS — H43813 Vitreous degeneration, bilateral: Secondary | ICD-10-CM | POA: Diagnosis not present

## 2024-01-01 DIAGNOSIS — Z961 Presence of intraocular lens: Secondary | ICD-10-CM | POA: Diagnosis not present

## 2024-01-01 DIAGNOSIS — H33193 Other retinoschisis and retinal cysts, bilateral: Secondary | ICD-10-CM | POA: Diagnosis not present

## 2024-01-29 DIAGNOSIS — H353231 Exudative age-related macular degeneration, bilateral, with active choroidal neovascularization: Secondary | ICD-10-CM | POA: Diagnosis not present

## 2024-02-26 DIAGNOSIS — H353231 Exudative age-related macular degeneration, bilateral, with active choroidal neovascularization: Secondary | ICD-10-CM | POA: Diagnosis not present

## 2024-03-07 DIAGNOSIS — M542 Cervicalgia: Secondary | ICD-10-CM | POA: Diagnosis not present

## 2024-03-07 DIAGNOSIS — I491 Atrial premature depolarization: Secondary | ICD-10-CM | POA: Diagnosis not present

## 2024-03-07 DIAGNOSIS — Z6835 Body mass index (BMI) 35.0-35.9, adult: Secondary | ICD-10-CM | POA: Diagnosis not present

## 2024-03-07 DIAGNOSIS — R0789 Other chest pain: Secondary | ICD-10-CM | POA: Diagnosis not present

## 2024-03-17 NOTE — Progress Notes (Unsigned)
 Cardiology Office Note   Date:  03/19/2024  ID:  Jmya, Uliano 11/11/1955, MRN [id number] PCP: Okey Carlin Redbird, MD  [facility] Providers Cardiologist:  Vinie JAYSON Maxcy, MD     History of Present Illness Kristin Myers is a 68 y.o. female with a past medical history of GERD, hypertension, generalized anxiety disorder, esophageal stricture, PACs  Patient previously underwent ABIs in 11/2022 that were within normal range bilaterally.  Carotid ultrasounds in 11/2022 showed no evidence of stenosis in bilateral ICAs.  She underwent CT cardiac scoring on 11/28/2023 that showed a coronary calcium  score of 11.8 (56th percentile).  Has a history of high blood pressure that is managed with amlodipine.  She was seen by her PCP on 03/07/2024.  Reported onset of irregular pulse, chest heaviness.  EKG in PCP office showed PACs.  Patient was referred to cardiology for further evaluation  On interview, patient reports that she has been having episodes of palpitations and chest heaviness for the past 3 months or so.  Reports that she frequently feels a fluttering feeling in her chest, estimates this occurs 20 times per day.  She checks her pulse and it often feels irregular.  When she gets these palpitations she also often develops mild chest heaviness.  At worst, heaviness gets to a 3/10 in intensity.  Chest heaviness is not exertional.  She does have some shortness of breath on exertion.  Works at [employer] and spends most of her day on her feet.  However, she feels like she could not walk more than a block without getting short of breath.  Denies syncope.  Occasionally gets dizzy if she stands up too quickly.  She does not drink caffeine. Does not smoke cigarettes.   Studies Reviewed Cardiac Studies & Procedures   ______________________________________________________________________________________________          CT SCANS  CT CARDIAC SCORING (SELF PAY ONLY)  11/28/2023  Addendum 01/02/2024  1:59 PM ADDENDUM REPORT: 01/02/2024 13:57  EXAM: OVER-READ INTERPRETATION  CT CHEST  The following report is an over-read performed by radiologist Dr. Jackquline Skates [facility] on 01/02/2024. This over-read does not include interpretation of cardiac or coronary anatomy or pathology. The calcium  score interpretation by the cardiologist is attached.  COMPARISON:  None.  FINDINGS: Limited view of the lung parenchyma demonstrates no suspicious nodularity. Airways are normal.  Limited view of the mediastinum demonstrates no adenopathy. Esophagus normal.  Limited view of the upper abdomen unremarkable.  Limited view of the skeleton and chest wall is unremarkable.  IMPRESSION: No significant extracardiac findings.   Electronically Signed By: Jackquline Boxer M.D. On: 01/02/2024 13:57  Narrative : CLINICAL DATA:  Cardiovascular Disease Risk stratification  EXAM:  Coronary Calcium  Score  TECHNIQUE:  A gated, non-contrast computed tomography scan of the heart was  performed using 3mm slice thickness. Axial images were analyzed on a  dedicated workstation. Calcium  scoring of the coronary arteries was  performed using the Agatston method.  FINDINGS:  Coronary Calcium  Score:  Left main: 0  Left anterior descending artery: 0  Left circumflex artery: 11.8  Right coronary artery: 0  Total: 11.8  Percentile: 56  Pericardium: Normal.  Ascending Aorta: Normal caliber.  Pulmonary artery: Normal caliber  Non-cardiac: See separate report from [facility].  IMPRESSION:  Coronary calcium  score of 11.8. This was 69 percentile for age-, race-,  and sex-matched controls.  RECOMMENDATIONS:  Coronary artery calcium  (CAC) score is a strong predictor of  incident coronary heart  disease (CHD) and provides predictive  information beyond traditional risk factors. CAC scoring is  reasonable to use in the  decision to withhold, postpone, or initiate  statin therapy in intermediate-risk or selected borderline-risk  asymptomatic adults (age 1-75 years and LDL-C >=70 to <190 mg/dL)  who do not have diabetes or established atherosclerotic  cardiovascular disease (ASCVD).* In intermediate-risk (10-year ASCVD  risk >=7.5% to <20%) adults or selected borderline-risk (10-year  ASCVD risk >=5% to <7.5%) adults in whom a CAC score is measured for  the purpose of making a treatment decision the following  recommendations have been made:  If CAC=0, it is reasonable to withhold statin therapy and reassess  in 5 to 10 years, as long as higher risk conditions are absent  (diabetes mellitus, family history of premature CHD in first degree  relatives (males <55 years; females <65 years), cigarette smoking,  or LDL >=190 mg/dL).  If CAC is 1 to 99, it is reasonable to initiate statin therapy for  patients >=64 years of age.  If CAC is >=100 or >=75th percentile, it is reasonable to initiate  statin therapy at any age.  Cardiology referral should be considered for patients with CAC  scores >=400 or >=75th percentile.  *2018 AHA/ACC/AACVPR/AAPA/ABC/ACPM/ADA/AGS/APhA/ASPC/NLA/PCNA  Guideline on the Management of Blood Cholesterol: A Report of the  American College of Cardiology/American Heart Association Task Force  on Clinical Practice Guidelines. J Am Coll Cardiol.  2019;73(24):3168-3209.  Electronically Signed: By: Lamar Fitch M.D. On: 11/28/2023 14:17     ______________________________________________________________________________________________      Risk Assessment/Calculations           Physical Exam VS:  BP 134/82   Pulse 84   Ht 5' (1.524 m)   Wt 178 lb 1.6 oz (80.8 kg)   SpO2 98%   BMI 34.78 kg/m        Wt Readings from Last 3 Encounters:  03/19/24 178 lb 1.6 oz (80.8 kg)  12/05/22 181 lb (82.1 kg)  09/06/22 182 lb 14.4 oz (83 kg)    GEN: Well  nourished, well developed in no acute distress. Sitting comfortably on the exam table  NECK: No JVD  CARDIAC:  RRR, no murmurs, rubs, gallops. Radial pulses 2+ bilaterally  RESPIRATORY:  Clear to auscultation without rales, wheezing or rhonchi. Normal WOB on room air   ABDOMEN: Soft, non-tender, non-distended EXTREMITIES:  No edema in BLE; No deformity   ASSESSMENT AND PLAN  Palpitations  - When seen by PCP on 7/11, patient reported feeling like her heartbeat was irregular.  Also had some chest heaviness associated with this.  EKG in PCP office showed PACs (patient brought EKG for review)  - Today, patient reports having frequent palpitations. When she feels her pulse it is often irregular  - Ordered 14 day zio  - Ordered echocardiogram  - Labs followed by PCP- her potassium, thyroid was checked at her annual physical and were reportedly normal at that time  - Patient denies caffeine use. Encouraged her to stay hydrated. She is on adderall, but has been on this for a long time without symptoms   Chest pain Shortness of breath  - Previously underwent coronary calcium  score in 11/2023 that showed coronary calcium  score 11.8 (56 percentile) - When patient has palpitations, she often has a feeling of chest heaviness as well. Denies exertional chest pain/heaviness. Able to do her job at [employer] without symptoms. Does have some dyspnea on exertion if she pushes herself harder than her usual activity  -  EKG from PCP office showed sinus rhythm with PACs, no ischemic changes  - Euvolemic on exam today. Denies lower extremity swelling or orthopnea  - Ordered stress test  - Ordered echocardiogram   HDL  - Lipid panel from 10/2023 showed LDL 154  - Start crestor  10 mg daily  - Plan to repeat LFTs, lipids in 8 weeks at follow up. Instructed patient to fast for that appointment   Informed Consent   Shared Decision Making/Informed Consent The risks [chest pain, shortness of breath, cardiac  arrhythmias, dizziness, blood pressure fluctuations, myocardial infarction, stroke/transient ischemic attack, nausea, vomiting, allergic reaction, radiation exposure, metallic taste sensation and life-threatening complications (estimated to be 1 in 10,000)], benefits (risk stratification, diagnosing coronary artery disease, treatment guidance) and alternatives of a nuclear stress test were discussed in detail with Ms. Breidenbach and she agrees to proceed.     Dispo: Follow up with APP in   Signed, Rollo FABIENE Louder, PA-C

## 2024-03-19 ENCOUNTER — Encounter: Payer: Self-pay | Admitting: Cardiology

## 2024-03-19 ENCOUNTER — Ambulatory Visit: Attending: Cardiology | Admitting: Cardiology

## 2024-03-19 ENCOUNTER — Ambulatory Visit: Attending: Cardiology

## 2024-03-19 VITALS — BP 134/82 | HR 84 | Ht 60.0 in | Wt 178.1 lb

## 2024-03-19 DIAGNOSIS — R0609 Other forms of dyspnea: Secondary | ICD-10-CM | POA: Diagnosis not present

## 2024-03-19 DIAGNOSIS — I491 Atrial premature depolarization: Secondary | ICD-10-CM

## 2024-03-19 DIAGNOSIS — E782 Mixed hyperlipidemia: Secondary | ICD-10-CM | POA: Diagnosis not present

## 2024-03-19 DIAGNOSIS — R079 Chest pain, unspecified: Secondary | ICD-10-CM

## 2024-03-19 MED ORDER — ROSUVASTATIN CALCIUM 10 MG PO TABS: 10.0000 mg | ORAL_TABLET | Freq: Every day | ORAL | 3 refills | Status: AC

## 2024-03-19 NOTE — Patient Instructions (Signed)
 Medication Instructions:  Start Crestor  10 mg once a day  *If you need a refill on your cardiac medications before your next appointment, please call your pharmacy*  Lab Work: No labs If you have labs (blood work) drawn today and your tests are completely normal, you will receive your results only by: MyChart Message (if you have MyChart) OR A paper copy in the mail If you have any lab test that is abnormal or we need to change your treatment, we will call you to review the results.  Testing/Procedures: Your physician has requested that you have an echocardiogram. Echocardiography is a painless test that uses sound waves to create images of your heart. It provides your doctor with information about the size and shape of your heart and how well your heart's chambers and valves are working. This procedure takes approximately one hour. There are no restrictions for this procedure. Please do NOT wear cologne, perfume, aftershave, or lotions (deodorant is allowed). Please arrive 15 minutes prior to your appointment time.  Please note: We ask at that you not bring children with you during ultrasound (echo/ vascular) testing. Due to room size and safety concerns, children are not allowed in the ultrasound rooms during exams. Our front office staff cannot provide observation of children in our lobby area while testing is being conducted. An adult accompanying a patient to their appointment will only be allowed in the ultrasound room at the discretion of the ultrasound technician under special circumstances. We apologize for any inconvenience.  Rollo Louder PA-C has ordered a Lexiscan Myocardial Perfusion Imaging Study.  Please arrive 15 minutes prior to your appointment time for registration and insurance purposes.   The test will take approximately 3 to 4 hours to complete; you may bring reading material.  If someone comes with you to your appointment, they will need to remain in the main lobby  due to limited space in the testing area. **If you are pregnant or breastfeeding, please notify the nuclear lab prior to your appointment**   How to prepare for your Myocardial Perfusion Test: Do not eat or drink 3 hours prior to your test, except you may have water. Do not consume products containing caffeine (regular or decaffeinated) 12 hours prior to your test. (ex: coffee, chocolate, sodas, tea). Do wear comfortable clothes (no dresses or overalls) and walking shoes, tennis shoes preferred (No heels or open toe shoes are allowed). Do NOT wear cologne, perfume, aftershave, or lotions (deodorant is allowed). If you use an inhaler, use it the AM of your test and bring it with you.  If you use a nebulizer, use it the AM of your test.  If these instructions are not followed, your test will have to be rescheduled. Follow-Up: At [facility], you and your health needs are our priority.  As part of our continuing mission to provide you with exceptional heart care, our providers are all part of one team.  This team includes your primary Cardiologist (physician) and Advanced Practice Providers or APPs (Physician Assistants and Nurse Practitioners) who all work together to provide you with the care you need, when you need it.  Your next appointment:   8 week(s)  Provider:   Any APP  We recommend signing up for the patient portal called MyChart.  Sign up information is provided on this After Visit Summary.  MyChart is used to connect with patients for Virtual Visits (Telemedicine).  Patients are able to view lab/test results, encounter notes, upcoming appointments, etc.  Non-urgent messages can be sent to your provider as well.   To learn more about what you can do with MyChart, go to [email or web address].   Other Instructions ZIO XT- Long Term Monitor Instructions  Your physician has requested you wear a ZIO patch monitor for 14 days.  This is a single patch monitor. [employer]  supplies one patch monitor per enrollment. Additional stickers are not available. Please do not apply patch if you will be having a Nuclear Stress Test,  Echocardiogram, Cardiac CT, MRI, or Chest Xray during the period you would be wearing the  monitor. The patch cannot be worn during these tests. You cannot remove and re-apply the  ZIO XT patch monitor.  Your ZIO patch monitor will be mailed 3 day USPS to your address on file. It may take 3-5 days  to receive your monitor after you have been enrolled.  Once you have received your monitor, please review the enclosed instructions. Your monitor  has already been registered assigning a specific monitor serial # to you.  Billing and Patient Assistance Program Information  We have supplied [employer] with any of your insurance information on file for billing purposes. [employer] offers a sliding scale Patient Assistance Program for patients that do not have  insurance, or whose insurance does not completely cover the cost of the ZIO monitor.  You must apply for the Patient Assistance Program to qualify for this discounted rate.  To apply, please call [employer] at [phone], select option 4, select option 2, ask to apply for  Patient Assistance Program. Meredeth will ask your household income, and how many people  are in your household. They will quote your out-of-pocket cost based on that information.  [employer] will also be able to set up a 56-month, interest-free payment plan if needed.  Applying the monitor   Shave hair from upper left chest.  Hold abrader disc by orange tab. Rub abrader in 40 strokes over the upper left chest as  indicated in your monitor instructions.  Clean area with 4 enclosed alcohol pads. Let dry.  Apply patch as indicated in monitor instructions. Patch will be placed under collarbone on left  side of chest with arrow pointing upward.  Rub patch adhesive wings for 2 minutes. Remove white label marked 1. Remove the white   label marked 2. Rub patch adhesive wings for 2 additional minutes.  While looking in a mirror, press and release button in center of patch. A small green light will  flash 3-4 times. This will be your only indicator that the monitor has been turned on.  Do not shower for the first 24 hours. You may shower after the first 24 hours.  Press the button if you feel a symptom. You will hear a small click. Record Date, Time and  Symptom in the Patient Logbook.  When you are ready to remove the patch, follow instructions on the last 2 pages of Patient  Logbook. Stick patch monitor onto the last page of Patient Logbook.  Place Patient Logbook in the blue and white box. Use locking tab on box and tape box closed  securely. The blue and white box has prepaid postage on it. Please place it in the mailbox as  soon as possible. Your physician should have your test results approximately 7 days after the  monitor has been mailed back to [facility].  Call [facility] Customer Care at [phone] if you have questions regarding  your ZIO XT patch monitor. Call them immediately if  you see an orange light blinking on your  monitor.  If your monitor falls off in less than 4 days, contact our Monitor department at [phone].  If your monitor becomes loose or falls off after 4 days call [employer] at [phone] for  suggestions on securing your monitor

## 2024-03-19 NOTE — Progress Notes (Unsigned)
Enrolled patient for a 14 day Zio XT monitor to be mailed to patients home  DOD to read

## 2024-03-25 DIAGNOSIS — H353231 Exudative age-related macular degeneration, bilateral, with active choroidal neovascularization: Secondary | ICD-10-CM | POA: Diagnosis not present

## 2024-03-26 DIAGNOSIS — F4311 Post-traumatic stress disorder, acute: Secondary | ICD-10-CM | POA: Diagnosis not present

## 2024-03-26 DIAGNOSIS — F900 Attention-deficit hyperactivity disorder, predominantly inattentive type: Secondary | ICD-10-CM | POA: Diagnosis not present

## 2024-03-31 ENCOUNTER — Encounter (HOSPITAL_COMMUNITY): Payer: Self-pay | Admitting: *Deleted

## 2024-03-31 ENCOUNTER — Telehealth (HOSPITAL_COMMUNITY): Payer: Self-pay | Admitting: *Deleted

## 2024-03-31 NOTE — Telephone Encounter (Signed)
 Letter sent with instructions for upcoming stress test via USPS. [place] Bees, RN

## 2024-04-07 ENCOUNTER — Other Ambulatory Visit: Payer: Self-pay | Admitting: Cardiology

## 2024-04-07 DIAGNOSIS — I491 Atrial premature depolarization: Secondary | ICD-10-CM

## 2024-04-08 DIAGNOSIS — I491 Atrial premature depolarization: Secondary | ICD-10-CM | POA: Diagnosis not present

## 2024-04-09 ENCOUNTER — Ambulatory Visit (HOSPITAL_BASED_OUTPATIENT_CLINIC_OR_DEPARTMENT_OTHER)
Admission: RE | Admit: 2024-04-09 | Discharge: 2024-04-09 | Disposition: A | Discharge status: Home / Self Care | Source: Ambulatory Visit | Attending: Family Medicine | Admitting: Family Medicine

## 2024-04-09 ENCOUNTER — Ambulatory Visit (HOSPITAL_COMMUNITY)
Admission: RE | Admit: 2024-04-09 | Discharge: 2024-04-09 | Disposition: A | Discharge status: Home / Self Care | Source: Ambulatory Visit | Attending: Cardiology | Admitting: Cardiology

## 2024-04-09 DIAGNOSIS — I491 Atrial premature depolarization: Secondary | ICD-10-CM | POA: Insufficient documentation

## 2024-04-09 DIAGNOSIS — Z780 Asymptomatic menopausal state: Secondary | ICD-10-CM

## 2024-04-09 LAB — MYOCARDIAL PERFUSION IMAGING
LV dias vol: 68 mL (ref 46–106)
LV sys vol: 17 mL (ref 3.8–5.2)
Nuc Stress EF: 75 %
Peak HR: 114 {beats}/min
Rest HR: 78 {beats}/min
Rest Nuclear Isotope Dose: 9.9 mCi
SDS: 0
SRS: 4
SSS: 0
ST Depression (mm): 0 mm
Stress Nuclear Isotope Dose: 31.5 mCi
TID: 1.08

## 2024-04-09 MED ORDER — REGADENOSON 0.4 MG/5ML IV SOLN
0.4000 mg | Freq: Once | INTRAVENOUS | Status: AC
  Administered 2024-04-09 (×2): 0.4 mg via INTRAVENOUS

## 2024-04-09 MED ORDER — AMINOPHYLLINE 25 MG/ML IV SOLN
INTRAVENOUS | Status: AC
  Filled 2024-04-09: qty 10

## 2024-04-09 MED ORDER — TECHNETIUM TC 99M TETROFOSMIN IV KIT
9.9000 | PACK | Freq: Once | INTRAVENOUS | Status: AC | PRN
  Administered 2024-04-09 (×2): 9.9 via INTRAVENOUS

## 2024-04-09 MED ORDER — REGADENOSON 0.4 MG/5ML IV SOLN
INTRAVENOUS | Status: AC
  Filled 2024-04-09: qty 5

## 2024-04-09 MED ORDER — TECHNETIUM TC 99M TETROFOSMIN IV KIT
31.5000 | PACK | Freq: Once | INTRAVENOUS | Status: AC | PRN
  Administered 2024-04-09 (×2): 31.5 via INTRAVENOUS

## 2024-04-10 ENCOUNTER — Ambulatory Visit: Payer: Self-pay | Admitting: Cardiology

## 2024-04-14 ENCOUNTER — Ambulatory Visit: Payer: Self-pay | Admitting: Cardiology

## 2024-04-18 DIAGNOSIS — I491 Atrial premature depolarization: Secondary | ICD-10-CM

## 2024-04-18 NOTE — Telephone Encounter (Signed)
-----   Message from Rollo FABIENE Louder sent at 04/10/2024 10:18 AM EDT ----- Please tell patient that her stress test was a normal, low risk study. LV perfusion normal, meaning the heart muscle is getting the blood flow that it needs. No evidence of ischemia (blockages in  coronary arteries), no evidence of infarction (scar). This is great news!  Thanks KJ  ----- Message ----- From: Lonni Slain, MD Sent: 04/09/2024   3:37 PM EDT To: Rollo JONELLE Louder, PA-C

## 2024-04-18 NOTE — Telephone Encounter (Signed)
 Called patient advised of below they verbalized understanding.

## 2024-04-21 NOTE — Telephone Encounter (Signed)
-----   Message from Rollo FABIENE Louder sent at 04/10/2024 10:18 AM EDT ----- Please tell patient that her stress test was a normal, low risk study. LV perfusion normal, meaning the heart muscle is getting the blood flow that it needs. No evidence of ischemia (blockages in  coronary arteries), no evidence of infarction (scar). This is great news!  Thanks KJ  ----- Message ----- From: Lonni Slain, MD Sent: 04/09/2024   3:37 PM EDT To: Rollo JONELLE Louder, PA-C

## 2024-04-21 NOTE — Telephone Encounter (Signed)
 Left message to call back.

## 2024-04-21 NOTE — Telephone Encounter (Signed)
-----   Message from Rollo FABIENE Louder sent at 04/21/2024  5:22 PM EDT ----- Please tell patient that her cardiac monitor showed predominantly normal sinus rhythm with an average HR of 79 BPM. She did have some runs of atrial tachycardia (fast heart beat from the top chambers  of the heart). These were brief and the longest lasted only 16 beats. She also did have quite a Production designer, theatre/television/film (extra beats from the top of the heart). These are not dangerous, but can cause a fluttering  feeling in the chest/palpitations. No atrial fibrillation, flutter, heart block, or pauses noted.   If she is still having palpitations, I recommend starting metoprolol succinate 25 mg daily. Avoid caffeine/alcohol and make sure to stay hydrated.   Thanks KJ  ----- Message ----- From: Elmira Newman PARAS, MD Sent: 04/18/2024   4:50 PM EDT To: Rollo JONELLE Louder, PA-C

## 2024-04-22 DIAGNOSIS — Z961 Presence of intraocular lens: Secondary | ICD-10-CM | POA: Diagnosis not present

## 2024-04-22 DIAGNOSIS — H33193 Other retinoschisis and retinal cysts, bilateral: Secondary | ICD-10-CM | POA: Diagnosis not present

## 2024-04-22 DIAGNOSIS — H35033 Hypertensive retinopathy, bilateral: Secondary | ICD-10-CM | POA: Diagnosis not present

## 2024-04-22 DIAGNOSIS — H353231 Exudative age-related macular degeneration, bilateral, with active choroidal neovascularization: Secondary | ICD-10-CM | POA: Diagnosis not present

## 2024-04-22 DIAGNOSIS — H43813 Vitreous degeneration, bilateral: Secondary | ICD-10-CM | POA: Diagnosis not present

## 2024-04-22 DIAGNOSIS — H31093 Other chorioretinal scars, bilateral: Secondary | ICD-10-CM | POA: Diagnosis not present

## 2024-04-23 ENCOUNTER — Ambulatory Visit (HOSPITAL_COMMUNITY)
Admission: RE | Admit: 2024-04-23 | Discharge: 2024-04-23 | Disposition: A | Discharge status: Home / Self Care | Source: Ambulatory Visit | Attending: Cardiology | Admitting: Cardiology

## 2024-04-23 DIAGNOSIS — R079 Chest pain, unspecified: Secondary | ICD-10-CM | POA: Insufficient documentation

## 2024-04-23 DIAGNOSIS — I491 Atrial premature depolarization: Secondary | ICD-10-CM | POA: Insufficient documentation

## 2024-04-23 DIAGNOSIS — R0609 Other forms of dyspnea: Secondary | ICD-10-CM | POA: Insufficient documentation

## 2024-04-23 LAB — ECHOCARDIOGRAM COMPLETE
Area-P 1/2: 3.01 cm2
S' Lateral: 2.6 cm

## 2024-04-24 NOTE — Telephone Encounter (Signed)
 Left message to call back.

## 2024-04-24 NOTE — Telephone Encounter (Signed)
-----   Message from Rollo FABIENE Louder sent at 04/24/2024 12:29 PM EDT ----- Please tell patient that her echocardiogram showed EF (pump strength of the heart) was normal at 60-65%.  There were no wall motion abnormalities.  RV function normal, RV size normal.  No significant  valvular abnormalities  Overall normal result.  No changes to treatment plan based on these results. ----- Message ----- From: Interface, Three One Seven Sent: 04/23/2024   1:06 PM EDT To: Rollo JONELLE Louder, PA-C

## 2024-05-08 NOTE — Progress Notes (Deleted)
  Cardiology Office Note   Date:  05/08/2024  ID:  Kiearra, Oyervides 1956/01/20, MRN [id number] PCP: Okey Carlin Redbird, MD  [facility] Providers Cardiologist:  Vinie JAYSON Maxcy, MD    History of Present Illness Kristin Myers is a 68 y.o. female with a past medical history of GERD, hypertension, generalized anxiety disorder, esophageal stricture, PACs   Patient previously underwent ABIs in 11/2022 that were within normal range bilaterally.  Carotid ultrasounds in 11/2022 showed no evidence of stenosis in bilateral ICAs.  She underwent CT cardiac scoring on 11/28/2023 that showed a coronary calcium  score of 11.8 (56th percentile).  Has a history of high blood pressure that is managed with amlodipine.   She was seen by her PCP on 03/07/2024.  Reported onset of irregular pulse, chest heaviness.  EKG in PCP office showed PACs.  Patient was referred to cardiology for further evaluation. I saw her in clinic on 03/19/24. At that time, she reported episodes of palpitations and chest heaviness. Wore a cardiac monitor that showed predominantly NSR with average HR 79 BPM, 15 episodes of atrial tachycardia with the longest lasting 16 beats, 12.7% PAC burden. Tress test 04/09/24 was a normal low risk study. Echocardiogram 04/23/24 showed EF 60-65%, no regional wall motion abnormalities, grade I DD, normal RV systolic function, no significant valvular abnormalities.   Palpitations  PACs - When last seen on 7/23, patient reported having frequent palpitations and irregular pulse  - 14 day zio showed predominantly NSR with average HR 79 BPM, 15 episodes of atrial tachycardia with the longest lasting 16 beats, 12.7% PAC burden - Echocardiogram 04/23/24 showed EF 60-65%, no regional wall motion abnormalities, grade I DD, normal RV systolic function, no significant valvular abnormalities -  - Patient denies caffeine use. Encouraged her to stay hydrated. She is on adderall, but has been on this for a  long time without symptoms    Chest pain Shortness of breath  - Previously underwent coronary calcium  score in 11/2023 that showed coronary calcium  score 11.8 (56 percentile) - Stress test from 03/2024 was a normal, low risk study    HDL  - Lipid panel from 10/2023 showed LDL 154  - Start crestor  10 mg daily  - Plan to repeat LFTs, lipids in 8 weeks at follow up. Instructed patient to fast for that appointment ***  HTN  -  - Continue amlodipine 5 mg daily   ROS: ***  Studies Reviewed      *** Risk Assessment/Calculations {Does this patient have ATRIAL FIBRILLATION?[phone]} No BP recorded.  {Refresh Note OR Click here to enter BP  :1}***       Physical Exam VS:  There were no vitals taken for this visit.       Wt Readings from Last 3 Encounters:  04/09/24 178 lb (80.7 kg)  03/19/24 178 lb 1.6 oz (80.8 kg)  12/05/22 181 lb (82.1 kg)    GEN: Well nourished, well developed in no acute distress NECK: No JVD; No carotid bruits CARDIAC: ***RRR, no murmurs, rubs, gallops RESPIRATORY:  Clear to auscultation without rales, wheezing or rhonchi  ABDOMEN: Soft, non-tender, non-distended EXTREMITIES:  No edema; No deformity   ASSESSMENT AND PLAN ***    {Are you ordering a CV Procedure (e.g. stress test, cath, DCCV, TEE, etc)?   Press F2        :[id number]}  Dispo: ***  Signed, Rollo FABIENE Louder, PA-C

## 2024-05-14 ENCOUNTER — Ambulatory Visit (HOSPITAL_BASED_OUTPATIENT_CLINIC_OR_DEPARTMENT_OTHER)
Admission: RE | Admit: 2024-05-14 | Discharge: 2024-05-14 | Disposition: A | Discharge status: Home / Self Care | Source: Ambulatory Visit | Attending: Family Medicine | Admitting: Family Medicine

## 2024-05-14 DIAGNOSIS — Z780 Asymptomatic menopausal state: Secondary | ICD-10-CM | POA: Diagnosis not present

## 2024-05-16 ENCOUNTER — Ambulatory Visit: Admitting: Cardiology

## 2024-05-20 DIAGNOSIS — H353231 Exudative age-related macular degeneration, bilateral, with active choroidal neovascularization: Secondary | ICD-10-CM | POA: Diagnosis not present

## 2024-05-30 DIAGNOSIS — I10 Essential (primary) hypertension: Secondary | ICD-10-CM | POA: Diagnosis not present

## 2024-05-30 DIAGNOSIS — H9192 Unspecified hearing loss, left ear: Secondary | ICD-10-CM | POA: Diagnosis not present

## 2024-05-30 DIAGNOSIS — E559 Vitamin D deficiency, unspecified: Secondary | ICD-10-CM | POA: Diagnosis not present

## 2024-05-30 DIAGNOSIS — E7800 Pure hypercholesterolemia, unspecified: Secondary | ICD-10-CM | POA: Diagnosis not present

## 2024-05-30 DIAGNOSIS — M810 Age-related osteoporosis without current pathological fracture: Secondary | ICD-10-CM | POA: Diagnosis not present

## 2024-06-03 DIAGNOSIS — F4311 Post-traumatic stress disorder, acute: Secondary | ICD-10-CM | POA: Diagnosis not present

## 2024-06-03 DIAGNOSIS — F902 Attention-deficit hyperactivity disorder, combined type: Secondary | ICD-10-CM | POA: Diagnosis not present

## 2024-06-08 NOTE — Progress Notes (Signed)
 Cardiology Office Note   Date:  06/20/2024  ID:  Kristin Myers, Proby 11-Nov-1955, MRN [id number] PCP: Kristin Carlin Redbird, MD  [facility] Providers Cardiologist:  Kristin JAYSON Maxcy, MD    History of Present Illness Kristin Myers is a 68 y.o. female with a past medical history of GERD, hypertension, generalized anxiety disorder, esophageal stricture, PACs   Patient previously underwent ABIs in 11/2022 that were within normal range bilaterally.  Carotid ultrasounds in 11/2022 showed no evidence of stenosis in bilateral ICAs.  She underwent CT cardiac scoring on 11/28/2023 that showed a coronary calcium  score of 11.8 (56th percentile).  Has a history of high blood pressure that is managed with amlodipine.   She was seen by her PCP on 03/07/2024.  Reported onset of irregular pulse, chest heaviness.  EKG in PCP office showed PACs.  Patient was referred to cardiology for further evaluation. I saw her in clinic on 03/19/24. At that time, she reported episodes of palpitations and chest heaviness. Wore a cardiac monitor that showed predominantly NSR with average HR 79 BPM, 15 episodes of atrial tachycardia with the longest lasting 16 beats, 12.7% PAC burden. Tress test 04/09/24 was a normal low risk study. Echocardiogram 04/23/24 showed EF 60-65%, no regional wall motion abnormalities, grade I DD, normal RV systolic function, no significant valvular abnormalities.   Today, patient presents for a follow-up appointment.  She tells me that she has been doing very well from a cardiac perspective.  We reviewed the studies that she had done since last visit.  She continues to have occasional palpitations.  Often feels like a fluttering her chest.  She does not drink very much caffeine at all.  Does a decent job staying hydrated.  She denies chest pain, shortness of breath.  Denies dizziness, syncope or near syncope.  Studies Reviewed Cardiac Studies & Procedures    ______________________________________________________________________________________________   STRESS TESTS  MYOCARDIAL PERFUSION IMAGING 04/09/2024  Interpretation Summary   The study is normal. The study is low risk.   No ST deviation was noted. The ECG was not diagnostic due to pharmacologic protocol.   LV perfusion is normal. There is no evidence of ischemia. There is no evidence of infarction.   Left ventricular function is normal. Nuclear stress EF: 75%. The left ventricular ejection fraction is hyperdynamic (>65%). End diastolic cavity size is normal. End systolic cavity size is normal.   CT images were obtained for attenuation correction and were examined for the presence of coronary calcium  when appropriate.   Coronary calcium  was present on the attenuation correction CT images. Minimal coronary calcifications were present.   Prior study not available for comparison.   ECHOCARDIOGRAM  ECHOCARDIOGRAM COMPLETE 04/23/2024  Narrative ECHOCARDIOGRAM REPORT    Patient Name:   Kristin Myers Date of Exam: 04/23/2024 Medical Rec #:  [id number]            Height:       60.0 in Accession #:    [id number]           Weight:       178.0 lb Date of Birth:  Jan 07, 1956           BSA:          1.776 m Patient Age:    67 years             BP:           140/89 mmHg Patient Gender: F  HR:           70 bpm. Exam Location:  [address]  Procedure: 2D Echo, 3D Echo, Cardiac Doppler, Color Doppler and Strain Analysis (Both Spectral and Color Flow Doppler were utilized during procedure).  Indications:    I49.1 PAC  History:        Patient has no prior history of Echocardiogram examinations. Signs/Symptoms:Dizziness/Lightheadedness and Shortness of Breath; Risk Factors:Hypertension.  Sonographer:    Kristin Myers RCS Referring Phys: [id number] ROLLO JONELLE Myers  IMPRESSIONS   1. Left ventricular ejection fraction, by estimation, is 60 to 65%. The left  ventricle has normal function. The left ventricle has no regional wall motion abnormalities. Left ventricular diastolic parameters are consistent with Grade I diastolic dysfunction (impaired relaxation). The average left ventricular global longitudinal strain is -18.5 %. The global longitudinal strain is normal. 2. Right ventricular systolic function is normal. The right ventricular size is normal. Tricuspid regurgitation signal is inadequate for assessing PA pressure. 3. The mitral valve is normal in structure. Trivial mitral valve regurgitation. No evidence of mitral stenosis. 4. The aortic valve is tricuspid. Aortic valve regurgitation is not visualized. No aortic stenosis is present. 5. The inferior vena cava is normal in size with greater than 50% respiratory variability, suggesting right atrial pressure of 3 mmHg.  Comparison(s): No prior Echocardiogram.  FINDINGS Left Ventricle: Left ventricular ejection fraction, by estimation, is 60 to 65%. The left ventricle has normal function. The left ventricle has no regional wall motion abnormalities. The average left ventricular global longitudinal strain is -18.5 %. Strain was performed and the global longitudinal strain is normal. The left ventricular internal cavity size was normal in size. There is no left ventricular hypertrophy. Left ventricular diastolic parameters are consistent with Grade I diastolic dysfunction (impaired relaxation).  Right Ventricle: The right ventricular size is normal. Right ventricular systolic function is normal. Tricuspid regurgitation signal is inadequate for assessing PA pressure. The tricuspid regurgitant velocity is 2.09 m/s, and with an assumed right atrial pressure of 3 mmHg, the estimated right ventricular systolic pressure is 20.5 mmHg.  Left Atrium: Left atrial size was normal in size.  Right Atrium: Right atrial size was normal in size.  Pericardium: There is no evidence of pericardial  effusion.  Mitral Valve: The mitral valve is normal in structure. Mild mitral annular calcification. Trivial mitral valve regurgitation. No evidence of mitral valve stenosis.  Tricuspid Valve: The tricuspid valve is normal in structure. Tricuspid valve regurgitation is trivial. No evidence of tricuspid stenosis.  Aortic Valve: The aortic valve is tricuspid. Aortic valve regurgitation is not visualized. No aortic stenosis is present.  Pulmonic Valve: The pulmonic valve was normal in structure. Pulmonic valve regurgitation is not visualized. No evidence of pulmonic stenosis.  Aorta: The aortic root is normal in size and structure.  Venous: The inferior vena cava is normal in size with greater than 50% respiratory variability, suggesting right atrial pressure of 3 mmHg.  IAS/Shunts: No atrial level shunt detected by color flow Doppler.  Additional Comments: 3D was performed not requiring image post processing on an independent workstation and was normal.   LEFT VENTRICLE PLAX 2D LVIDd:         3.70 cm   Diastology LVIDs:         2.60 cm   LV e' medial:    6.20 cm/s LV PW:         0.90 cm   LV E/e' medial:  12.1 LV IVS:  0.90 cm   LV e' lateral:   7.51 cm/s LVOT diam:     1.70 cm   LV E/e' lateral: 10.0 LV SV:         54 LV SV Index:   30        2D Longitudinal Strain LVOT Area:     2.27 cm  2D Strain GLS (A4C):   -17.9 % 2D Strain GLS (A3C):   -17.0 % 2D Strain GLS (A2C):   -20.5 % 2D Strain GLS Avg:     -18.5 %  3D Volume EF: 3D EF:        63 % LV EDV:       112 ml LV ESV:       42 ml LV SV:        70 ml  RIGHT VENTRICLE RV Basal diam:  3.30 cm RV S prime:     13.40 cm/s TAPSE (M-mode): 2.1 cm RVSP:           20.5 mmHg  LEFT ATRIUM             Index        RIGHT ATRIUM           Index LA diam:        2.90 cm 1.63 cm/m   RA Pressure: 3.00 mmHg LA Vol (A2C):   42.1 ml 23.70 ml/m  RA Area:     11.00 cm LA Vol (A4C):   29.3 ml 16.49 ml/m  RA Volume:   23.90 ml   13.45 ml/m LA Biplane Vol: 35.1 ml 19.76 ml/m AORTIC VALVE LVOT Vmax:   112.00 cm/s LVOT Vmean:  73.100 cm/s LVOT VTI:    0.238 m  AORTA Ao Root diam: 2.80 cm Ao Asc diam:  3.10 cm  MITRAL VALVE                TRICUSPID VALVE MV Area (PHT):              TR Peak grad:   17.5 mmHg MV Decel Time:              TR Vmax:        209.00 cm/s MV E velocity: 74.90 cm/s   Estimated RAP:  3.00 mmHg MV A velocity: 110.00 cm/s  RVSP:           20.5 mmHg MV E/A ratio:  0.68 SHUNTS Systemic VTI:  0.24 m Systemic Diam: 1.70 cm  Redell Shallow MD Electronically signed by Redell Shallow MD Signature Date/Time: 04/23/2024/1:06:36 PM    Final    MONITORS  LONG TERM MONITOR (3-14 DAYS) 04/09/2024  Narrative Zio patch monitor 12 days 03/21/2024 - 04/03/2024: Dominant rhythm: Sinus. HR 49-136 bpm. Avg HR 79 bpm, in sinus rhythm. 15 episodes of atrial tachycardia, fastest at 200 bpm for 6 beats, longest for 16 beats at 148 bpm. 12.7% isolated SVE, <1% couplet/triplets. 2 episodes of VT, fastest and longest at 197 bpm for 6 beats. <1% isolated VE, couplet/triplets. No atrial fibrillation/atrial flutter/high grade AV block, sinus pause >3sec noted. 43 patient triggered events, correlated with SVE.   CT SCANS  CT CARDIAC SCORING (SELF PAY ONLY) 11/28/2023  Addendum 01/02/2024  1:59 PM ADDENDUM REPORT: 01/02/2024 13:57  EXAM: OVER-READ INTERPRETATION  CT CHEST  The following report is an over-read performed by radiologist Dr. Jackquline Skates [facility] on 01/02/2024. This over-read does not include interpretation of cardiac or coronary anatomy or pathology. The calcium  score interpretation  by the cardiologist is attached.  COMPARISON:  None.  FINDINGS: Limited view of the lung parenchyma demonstrates no suspicious nodularity. Airways are normal.  Limited view of the mediastinum demonstrates no adenopathy. Esophagus normal.  Limited view of the upper abdomen  unremarkable.  Limited view of the skeleton and chest wall is unremarkable.  IMPRESSION: No significant extracardiac findings.   Electronically Signed By: Jackquline Boxer M.D. On: 01/02/2024 13:57  Narrative : CLINICAL DATA:  Cardiovascular Disease Risk stratification  EXAM:  Coronary Calcium  Score  TECHNIQUE:  A gated, non-contrast computed tomography scan of the heart was  performed using 3mm slice thickness. Axial images were analyzed on a  dedicated workstation. Calcium  scoring of the coronary arteries was  performed using the Agatston method.  FINDINGS:  Coronary Calcium  Score:  Left main: 0  Left anterior descending artery: 0  Left circumflex artery: 11.8  Right coronary artery: 0  Total: 11.8  Percentile: 56  Pericardium: Normal.  Ascending Aorta: Normal caliber.  Pulmonary artery: Normal caliber  Non-cardiac: See separate report from [facility].  IMPRESSION:  Coronary calcium  score of 11.8. This was 42 percentile for age-, race-,  and sex-matched controls.  RECOMMENDATIONS:  Coronary artery calcium  (CAC) score is a strong predictor of  incident coronary heart disease (CHD) and provides predictive  information beyond traditional risk factors. CAC scoring is  reasonable to use in the decision to withhold, postpone, or initiate  statin therapy in intermediate-risk or selected borderline-risk  asymptomatic adults (age 5-75 years and LDL-C >=70 to <190 mg/dL)  who do not have diabetes or established atherosclerotic  cardiovascular disease (ASCVD).* In intermediate-risk (10-year ASCVD  risk >=7.5% to <20%) adults or selected borderline-risk (10-year  ASCVD risk >=5% to <7.5%) adults in whom a CAC score is measured for  the purpose of making a treatment decision the following  recommendations have been made:  If CAC=0, it is reasonable to withhold statin therapy and reassess  in 5 to 10 years, as long as higher risk  conditions are absent  (diabetes mellitus, family history of premature CHD in first degree  relatives (males <55 years; females <65 years), cigarette smoking,  or LDL >=190 mg/dL).  If CAC is 1 to 99, it is reasonable to initiate statin therapy for  patients >=67 years of age.  If CAC is >=100 or >=75th percentile, it is reasonable to initiate  statin therapy at any age.  Cardiology referral should be considered for patients with CAC  scores >=400 or >=75th percentile.  *2018 AHA/ACC/AACVPR/AAPA/ABC/ACPM/ADA/AGS/APhA/ASPC/NLA/PCNA  Guideline on the Management of Blood Cholesterol: A Report of the  American College of Cardiology/American Heart Association Task Force  on Clinical Practice Guidelines. J Am Coll Cardiol.  2019;73(24):3168-3209.  Electronically Signed: By: Lamar Fitch M.D. On: 11/28/2023 14:17     ______________________________________________________________________________________________      Risk Assessment/Calculations           Physical Exam VS:  BP 122/80 Comment: recheck  Pulse 88   Ht 5' (1.524 m)   Wt 166 lb 12.8 oz (75.7 kg)   SpO2 96%   BMI 32.58 kg/m        Wt Readings from Last 3 Encounters:  06/20/24 166 lb 12.8 oz (75.7 kg)  04/09/24 178 lb (80.7 kg)  03/19/24 178 lb 1.6 oz (80.8 kg)    GEN: Well nourished, well developed in no acute distress.  Sitting comfortably on the exam table NECK: No JVD  CARDIAC:  RRR, no murmurs, rubs, gallops.  Radial pulses 2+ bilaterally RESPIRATORY:  Clear to auscultation without rales, wheezing or rhonchi  ABDOMEN: Soft, non-tender, non-distended EXTREMITIES:  No edema in bilateral lower extremities; No deformity   ASSESSMENT AND PLAN  Palpitations  PACs - When last seen on 03/19/24, patient reported having frequent palpitations and irregular pulse  - 14 day zio showed predominantly NSR with average HR 79 BPM, 15 episodes of atrial tachycardia with the longest lasting 16 beats, 12.7%  PAC burden - Echocardiogram 04/23/24 showed EF 60-65%, no regional wall motion abnormalities, grade I DD, normal RV systolic function, no significant valvular abnormalities - She continues to have palpitations. EKG today shows sinus rhythm with PACs  - Start metoprolol succinate 25 mg daily  - Patient denies caffeine use. Encouraged her to stay hydrated. She is on adderall, but has been on this for a long time without symptoms    Chest pain Shortness of breath  - Reported SOB and chest pain/tightness at last visit in 02/2024.  - Previously underwent coronary calcium  score in 11/2023 that showed coronary calcium  score 11.8 (56 percentile) - Stress test from 03/2024 was a normal, low risk study  - Echocardiogram from 03/2024 showed EF 60-65%, no wall motion abnormalities  - Patient denies chest pain or SOB today    HDL  - Lipid panel from 10/2023 showed LDL 154  - Start crestor  10 mg daily  - Ordered repeat lipids and LFTs   HTN  - BP well controlled. No symptoms concerning for orthostatic hypotension  - Continue amlodipine 5 mg daily  - Adding metoprolol succinate 25 mg daily as above for PACs    Dispo: Follow up in 1 year with Dr. Mona   Signed, Rollo FABIENE Louder, PA-C

## 2024-06-14 DIAGNOSIS — I7300 Raynaud's syndrome without gangrene: Secondary | ICD-10-CM | POA: Diagnosis not present

## 2024-06-20 ENCOUNTER — Encounter: Payer: Self-pay | Admitting: Cardiology

## 2024-06-20 ENCOUNTER — Ambulatory Visit: Attending: Cardiology | Admitting: Cardiology

## 2024-06-20 VITALS — BP 122/80 | HR 88 | Ht 60.0 in | Wt 166.8 lb

## 2024-06-20 DIAGNOSIS — E782 Mixed hyperlipidemia: Secondary | ICD-10-CM | POA: Diagnosis not present

## 2024-06-20 DIAGNOSIS — I779 Disorder of arteries and arterioles, unspecified: Secondary | ICD-10-CM

## 2024-06-20 DIAGNOSIS — R079 Chest pain, unspecified: Secondary | ICD-10-CM | POA: Diagnosis not present

## 2024-06-20 DIAGNOSIS — R0609 Other forms of dyspnea: Secondary | ICD-10-CM

## 2024-06-20 DIAGNOSIS — I491 Atrial premature depolarization: Secondary | ICD-10-CM

## 2024-06-20 MED ORDER — METOPROLOL SUCCINATE ER 25 MG PO TB24: 25.0000 mg | ORAL_TABLET | Freq: Every day | ORAL | 3 refills | Status: AC

## 2024-06-20 NOTE — Patient Instructions (Signed)
 Medication Instructions:  Start Metoprolol Succinate ( Toprol XL) 25 mg take one tablet dialy *If you need a refill on your cardiac medications before your next appointment, please call your pharmacy*  Lab Work: Fasting Lipids, LFTs at your earliest convenience  If you have labs (blood work) drawn today and your tests are completely normal, you will receive your results only by: MyChart Message (if you have MyChart) OR A paper copy in the mail If you have any lab test that is abnormal or we need to change your treatment, we will call you to review the results.  Follow-Up: At [facility], you and your health needs are our priority.  As part of our continuing mission to provide you with exceptional heart care, our providers are all part of one team.  This team includes your primary Cardiologist (physician) and Advanced Practice Providers or APPs (Physician Assistants and Nurse Practitioners) who all work together to provide you with the care you need, when you need it.  Your next appointment:   1 year(s)  Provider:   Vinie JAYSON Maxcy, MD

## 2024-06-24 DIAGNOSIS — H353231 Exudative age-related macular degeneration, bilateral, with active choroidal neovascularization: Secondary | ICD-10-CM | POA: Diagnosis not present

## 2024-07-09 ENCOUNTER — Other Ambulatory Visit

## 2024-07-22 DIAGNOSIS — H353231 Exudative age-related macular degeneration, bilateral, with active choroidal neovascularization: Secondary | ICD-10-CM | POA: Diagnosis not present

## 2024-07-30 DIAGNOSIS — F4311 Post-traumatic stress disorder, acute: Secondary | ICD-10-CM | POA: Diagnosis not present

## 2024-07-30 DIAGNOSIS — F900 Attention-deficit hyperactivity disorder, predominantly inattentive type: Secondary | ICD-10-CM | POA: Diagnosis not present

## 2024-08-06 DIAGNOSIS — F4311 Post-traumatic stress disorder, acute: Secondary | ICD-10-CM | POA: Diagnosis not present

## 2024-08-06 DIAGNOSIS — F322 Major depressive disorder, single episode, severe without psychotic features: Secondary | ICD-10-CM | POA: Diagnosis not present

## 2024-08-06 DIAGNOSIS — F902 Attention-deficit hyperactivity disorder, combined type: Secondary | ICD-10-CM | POA: Diagnosis not present

## 2024-08-11 ENCOUNTER — Other Ambulatory Visit (HOSPITAL_BASED_OUTPATIENT_CLINIC_OR_DEPARTMENT_OTHER)

## 2024-08-15 ENCOUNTER — Telehealth: Payer: Self-pay | Admitting: Cardiology

## 2024-08-15 NOTE — Telephone Encounter (Signed)
 Spoke with patient regarding metoprolol  medication regimen. Pt states she got confused and hasn't taken in about a month. She has refills, just hasn't picked them up.   Per Rollo Louder, PA: Start metoprolol  succinate 25 mg daily.   Advised patient to continue taking this medication and to monitor blood pressures and heart rate. No further needs at this time. Pt verbalizes understanding of plan.

## 2024-08-15 NOTE — Telephone Encounter (Signed)
 Patient requesting a follow up regarding metoprolol  succinate (TOPROL  XL) 25 MG 24 hr tablet and if she is supposed to stop or continue taking. Please follow up.

## 2024-08-19 DIAGNOSIS — H33193 Other retinoschisis and retinal cysts, bilateral: Secondary | ICD-10-CM | POA: Diagnosis not present

## 2024-08-19 DIAGNOSIS — H43813 Vitreous degeneration, bilateral: Secondary | ICD-10-CM | POA: Diagnosis not present

## 2024-08-19 DIAGNOSIS — H353231 Exudative age-related macular degeneration, bilateral, with active choroidal neovascularization: Secondary | ICD-10-CM | POA: Diagnosis not present

## 2024-08-19 DIAGNOSIS — H35033 Hypertensive retinopathy, bilateral: Secondary | ICD-10-CM | POA: Diagnosis not present

## 2024-08-19 DIAGNOSIS — H31093 Other chorioretinal scars, bilateral: Secondary | ICD-10-CM | POA: Diagnosis not present
